# Patient Record
Sex: Male | Born: 1980 | Race: White | Hispanic: No | Marital: Single | Smoking: Never smoker
Health system: Southern US, Community
[De-identification: ages and names within clinical notes are randomized; demographics above are authoritative.]

## PROBLEM LIST (undated history)

## (undated) DIAGNOSIS — E785 Hyperlipidemia, unspecified: Secondary | ICD-10-CM

## (undated) DIAGNOSIS — R569 Unspecified convulsions: Secondary | ICD-10-CM

## (undated) DIAGNOSIS — F845 Asperger's syndrome: Secondary | ICD-10-CM

## (undated) DIAGNOSIS — F429 Obsessive-compulsive disorder, unspecified: Secondary | ICD-10-CM

## (undated) HISTORY — DX: Asperger's syndrome: F84.5

## (undated) HISTORY — DX: Obsessive-compulsive disorder, unspecified: F42.9

## (undated) HISTORY — DX: Hyperlipidemia, unspecified: E78.5

---
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
## 2002-05-24 ENCOUNTER — Emergency Department (HOSPITAL_COMMUNITY): Payer: Self-pay | Admitting: Emergency Medicine-WVUH

## 2005-04-04 ENCOUNTER — Ambulatory Visit (HOSPITAL_COMMUNITY): Admission: RE | Admit: 2005-04-04 | Discharge: 2005-04-04 | Payer: Self-pay | Admitting: Family Medicine

## 2005-12-27 ENCOUNTER — Emergency Department (HOSPITAL_COMMUNITY): Admission: EM | Admit: 2005-12-27 | Discharge: 2005-12-27 | Payer: Self-pay | Admitting: Emergency Medicine

## 2007-12-29 ENCOUNTER — Emergency Department (HOSPITAL_COMMUNITY): Admission: EM | Admit: 2007-12-29 | Discharge: 2007-12-29 | Payer: Self-pay | Admitting: Emergency Medicine

## 2008-08-25 ENCOUNTER — Ambulatory Visit (HOSPITAL_COMMUNITY): Admission: RE | Admit: 2008-08-25 | Discharge: 2008-08-25 | Payer: Self-pay | Admitting: Family Medicine

## 2010-01-10 ENCOUNTER — Ambulatory Visit (HOSPITAL_COMMUNITY)
Admission: RE | Admit: 2010-01-10 | Discharge: 2010-01-10 | Payer: Self-pay | Source: Home / Self Care | Attending: Family Medicine | Admitting: Family Medicine

## 2011-01-23 ENCOUNTER — Ambulatory Visit (INDEPENDENT_AMBULATORY_CARE_PROVIDER_SITE_OTHER): Payer: Medicaid Other | Admitting: Otolaryngology

## 2011-01-23 DIAGNOSIS — H6120 Impacted cerumen, unspecified ear: Secondary | ICD-10-CM

## 2011-05-22 ENCOUNTER — Ambulatory Visit (INDEPENDENT_AMBULATORY_CARE_PROVIDER_SITE_OTHER): Payer: Medicare Other | Admitting: Otolaryngology

## 2011-05-22 DIAGNOSIS — H6120 Impacted cerumen, unspecified ear: Secondary | ICD-10-CM

## 2011-10-09 ENCOUNTER — Ambulatory Visit (INDEPENDENT_AMBULATORY_CARE_PROVIDER_SITE_OTHER): Payer: Medicare Other | Admitting: Otolaryngology

## 2011-10-09 DIAGNOSIS — H6120 Impacted cerumen, unspecified ear: Secondary | ICD-10-CM

## 2011-12-15 DIAGNOSIS — L219 Seborrheic dermatitis, unspecified: Secondary | ICD-10-CM | POA: Insufficient documentation

## 2011-12-15 DIAGNOSIS — D229 Melanocytic nevi, unspecified: Secondary | ICD-10-CM | POA: Insufficient documentation

## 2012-04-06 ENCOUNTER — Other Ambulatory Visit: Payer: Self-pay | Admitting: Family Medicine

## 2012-04-06 LAB — HEPATIC FUNCTION PANEL
ALT: 38 U/L (ref 0–53)
AST: 21 U/L (ref 0–37)
Albumin: 4.5 g/dL (ref 3.5–5.2)
Alkaline Phosphatase: 53 U/L (ref 39–117)
Total Protein: 6.8 g/dL (ref 6.0–8.3)

## 2012-04-06 LAB — GLUCOSE, RANDOM: Glucose, Bld: 94 mg/dL (ref 70–99)

## 2012-04-06 LAB — LIPID PANEL
Cholesterol: 228 mg/dL — ABNORMAL HIGH (ref 0–200)
HDL: 28 mg/dL — ABNORMAL LOW (ref >39)
Total CHOL/HDL Ratio: 8.1 Ratio
Triglycerides: 367 mg/dL — ABNORMAL HIGH (ref <150)

## 2012-04-06 LAB — SEDIMENTATION RATE: Sed Rate: 1 mm/hr (ref 0–16)

## 2012-04-15 ENCOUNTER — Telehealth: Payer: Self-pay | Admitting: Family Medicine

## 2012-04-15 NOTE — Telephone Encounter (Signed)
Discussed lab results with patient and his mother. Mother stated that lab work for arthritis and inflammation was suppose to be done also because that is what patient was originally seen for. Please advise.

## 2012-04-15 NOTE — Telephone Encounter (Signed)
Patients mother is returning your call. Please call her back ASAP.   If you cannot reach her at her home number, you can reach her at her cell. [phone]

## 2012-04-19 ENCOUNTER — Telehealth: Payer: Self-pay | Admitting: Family Medicine

## 2012-04-19 NOTE — Telephone Encounter (Signed)
Pt would like copy of his BW mailed to his home address please

## 2012-04-19 NOTE — Telephone Encounter (Signed)
Results mailed to patient per patient request.

## 2012-11-02 ENCOUNTER — Ambulatory Visit (INDEPENDENT_AMBULATORY_CARE_PROVIDER_SITE_OTHER): Payer: Medicare Other | Admitting: Family Medicine

## 2012-11-02 ENCOUNTER — Encounter: Payer: Self-pay | Admitting: Family Medicine

## 2012-11-02 VITALS — BP 132/90 | Temp 98.5°F | Ht 69.0 in | Wt 228.8 lb

## 2012-11-02 DIAGNOSIS — J329 Chronic sinusitis, unspecified: Secondary | ICD-10-CM

## 2012-11-02 MED ORDER — AZITHROMYCIN 250 MG PO TABS: ORAL_TABLET | ORAL | Status: DC

## 2012-11-02 NOTE — Progress Notes (Signed)
  Subjective:    Patient ID: Jeffrey Compton, male    DOB: 1980/10/25, 32 y.o.   MRN: [id number]  Sinusitis This is a new problem. The current episode started in the past 7 days. Associated symptoms include chills, congestion, coughing, headaches, a hoarse voice and sinus pressure. Past treatments include acetaminophen. The treatment provided mild relief.   Started sat aft,  No known exposure  Started sore thraot, presssure in the sinus, positve green disch  No measureable fever   Review of Systems  Constitutional: Positive for chills.  HENT: Positive for congestion, hoarse voice and sinus pressure.   Respiratory: Positive for cough.   Neurological: Positive for headaches.       Objective:   Physical Exam Alert no acute distress. Lungs clear. Heart regular in rhythm. Frontal and maxillary tenderness evident. Nasal congestion present. Pharynx erythematous neck supple. Lungs clear heart regular in rhythm.  Blood pressure repeat 132/80     Assessment & Plan:  Impression acute rhinosinusitis. Plan Z-Pak. Patient states this has worked well in the past. Symptomatic care discussed. WSL

## 2013-01-19 ENCOUNTER — Telehealth: Payer: Self-pay | Admitting: Family Medicine

## 2013-01-19 ENCOUNTER — Ambulatory Visit: Payer: Medicare Other | Admitting: Family Medicine

## 2013-01-19 MED ORDER — PREDNISONE 20 MG PO TABS: ORAL_TABLET | ORAL | Status: DC

## 2013-01-19 NOTE — Telephone Encounter (Signed)
Pt would like to go ahead and get something called in for the poison oak sent to [facility], appt cancelled

## 2013-01-19 NOTE — Telephone Encounter (Signed)
Same dose as fa

## 2013-01-19 NOTE — Telephone Encounter (Signed)
Rx sent electronically to pharmacy. Patient notified. 

## 2013-01-19 NOTE — Telephone Encounter (Signed)
Requests prednisone taper 

## 2013-02-08 ENCOUNTER — Emergency Department (HOSPITAL_COMMUNITY)
Admission: EM | Admit: 2013-02-08 | Discharge: 2013-02-09 | Disposition: A | Discharge status: Home / Self Care | Payer: Medicare Other | Attending: Emergency Medicine | Admitting: Emergency Medicine

## 2013-02-08 ENCOUNTER — Encounter (HOSPITAL_COMMUNITY): Payer: Self-pay | Admitting: Emergency Medicine

## 2013-02-08 DIAGNOSIS — T5491XA Toxic effect of unspecified corrosive substance, accidental (unintentional), initial encounter: Secondary | ICD-10-CM | POA: Insufficient documentation

## 2013-02-08 DIAGNOSIS — Z792 Long term (current) use of antibiotics: Secondary | ICD-10-CM | POA: Insufficient documentation

## 2013-02-08 DIAGNOSIS — Y929 Unspecified place or not applicable: Secondary | ICD-10-CM | POA: Insufficient documentation

## 2013-02-08 DIAGNOSIS — IMO0002 Reserved for concepts with insufficient information to code with codable children: Secondary | ICD-10-CM | POA: Insufficient documentation

## 2013-02-08 DIAGNOSIS — Y93E9 Activity, other interior property and clothing maintenance: Secondary | ICD-10-CM | POA: Insufficient documentation

## 2013-02-08 DIAGNOSIS — J029 Acute pharyngitis, unspecified: Secondary | ICD-10-CM | POA: Insufficient documentation

## 2013-02-08 MED ORDER — FAMOTIDINE 20 MG PO TABS: 20.0000 mg | ORAL_TABLET | Freq: Two times a day (BID) | ORAL | Status: DC

## 2013-02-08 MED ORDER — ESOMEPRAZOLE MAGNESIUM 40 MG PO CPDR: 40.0000 mg | DELAYED_RELEASE_CAPSULE | Freq: Every day | ORAL | Status: DC

## 2013-02-08 MED ORDER — FAMOTIDINE 20 MG PO TABS
20.0000 mg | ORAL_TABLET | Freq: Once | ORAL | Status: AC
  Administered 2013-02-08: 20 mg via ORAL
  Filled 2013-02-08: qty 1

## 2013-02-08 MED ORDER — PANTOPRAZOLE SODIUM 40 MG PO TBEC
40.0000 mg | DELAYED_RELEASE_TABLET | Freq: Once | ORAL | Status: AC
  Administered 2013-02-08: 40 mg via ORAL
  Filled 2013-02-08: qty 1

## 2013-02-08 NOTE — ED Provider Notes (Signed)
CSN: [id number]     Arrival date & time 02/08/13  2234 History   First MD Initiated Contact with Patient 02/08/13 2328     Chief Complaint  Patient presents with  . Ingestion   (Consider location/radiation/quality/duration/timing/severity/associated sxs/prior Treatment) HPI Comments: Patient is a 33 year old male who presents to the emergency department after having ingested a small amount of Clorox solution. The patient states that he put a small about 1 ounce of Clorox in 8 ounces of water and placed in a thermos for cleaning. Approximately 2 hours before arrival to the emergency department he drank one swallow of this solution. The patient states his son was in his stomach he vomited. But his head complaining of sore throat since that time. He has not been coughing. He has not been coughing up any blood. He does not recall feeling as though he may have aspirated anything. He states he has been able to drink since this incident occurred. The patient has not taken any medication for the problem up to this point.  Patient is a 33 y.o. male presenting with Ingested Medication. The history is provided by the patient.  Ingestion This is a new problem. Pertinent negatives include no abdominal pain, arthralgias, chest pain, coughing or neck pain.    History reviewed. No pertinent past medical history. History reviewed. No pertinent past surgical history. No family history on file. History  Substance Use Topics  . Smoking status: Never Smoker   . Smokeless tobacco: Not on file  . Alcohol Use: No    Review of Systems  Constitutional: Negative for activity change.       All ROS Neg except as noted in HPI  HENT: Negative for nosebleeds.   Eyes: Negative for photophobia and discharge.  Respiratory: Negative for cough, shortness of breath and wheezing.   Cardiovascular: Negative for chest pain and palpitations.  Gastrointestinal: Negative for abdominal pain and blood in stool.  Genitourinary:  Negative for dysuria, frequency and hematuria.  Musculoskeletal: Negative for arthralgias, back pain and neck pain.  Skin: Negative.   Neurological: Negative for dizziness, seizures and speech difficulty.  Psychiatric/Behavioral: Negative for hallucinations and confusion.    Allergies  Keflex  Home Medications   Current Outpatient Rx  Name  Route  Sig  Dispense  Refill  . azithromycin (ZITHROMAX Z-PAK) 250 MG tablet      Take 2 tablets (500 mg) on  Day 1,  followed by 1 tablet (250 mg) once daily on Days 2 through 5.   6 each   0   . predniSONE (DELTASONE) 20 MG tablet      3 a day for 3 days, 2 a day for 3 days and 1 a day for 2 days.   17 tablet   0    BP 129/86  Pulse 104  Temp(Src) 98.1 F (36.7 C) (Oral)  Resp 16  Ht 5\' 9"  (1.753 m)  Wt 223 lb (101.152 kg)  BMI 32.92 kg/m2  SpO2 98% Physical Exam  Nursing note and vitals reviewed. Constitutional: He is oriented to person, place, and time. He appears well-developed and well-nourished.  Non-toxic appearance.  HENT:  Head: Normocephalic.  Right Ear: Tympanic membrane normal.  Left Ear: Tympanic membrane normal.  There no burns, redness, or areas of irritation of the outer lips, or the mucosa of the lips. There is no lesions or noted trauma or injury to the tongue. There no burns or redness or injury to the posterior pharynx. The uvula is in  the midline, and appears to be of normal size. The posterior pharynx is pain to any shows no burn or swelling. The patient can swallow without problem. The trachea is in the midline.  Eyes: EOM and lids are normal. Pupils are equal, round, and reactive to light.  Neck: Normal range of motion. Neck supple. Carotid bruit is not present. No tracheal deviation present.  Cardiovascular: Normal rate, regular rhythm, normal heart sounds, intact distal pulses and normal pulses.   Pulmonary/Chest: Breath sounds normal. No stridor. No respiratory distress. He has no wheezes. He has no rales.   Abdominal: Soft. Bowel sounds are normal. There is no tenderness. There is no guarding.  Musculoskeletal: Normal range of motion.  Lymphadenopathy:       Head (right side): No submandibular adenopathy present.       Head (left side): No submandibular adenopathy present.    He has no cervical adenopathy.  Neurological: He is alert and oriented to person, place, and time. He has normal strength. No cranial nerve deficit or sensory deficit.  Skin: Skin is warm and dry.  Psychiatric: He has a normal mood and affect. His speech is normal.    ED Course  Procedures (including critical care time) Labs Review Labs Reviewed - No data to display Imaging Review No results found.  EKG Interpretation   None       MDM  No diagnosis found. *I have reviewed nursing notes, vital signs, and all appropriate lab and imaging results for this patient.**  No evidence of burns, or trauma to the mucosa of the mouth, or the posterior pharynx. The patient will be treated with Protonix and Pepcid for possible reflux. The patient is advised to return to the emergency department if any changes, problems, or concerns.  Lenox Ahr, PA-C 02/08/13 2353

## 2013-02-08 NOTE — Discharge Instructions (Signed)
Please increase water intake. Please use Nexium daily, Pepcid 2 times daily. Please return to the emergency department if any difficulty swallowing or any other problems.

## 2013-02-08 NOTE — ED Notes (Signed)
Pt had put approx 8oz of water and one oz of clorox in a thermos for cleaning purposes this morning and then forgot the clorox was in there and drank one swallow. Vomited immediately after. Now c/o sore throat.

## 2013-02-09 NOTE — ED Provider Notes (Signed)
Medical screening examination/treatment/procedure(s) were performed by non-physician practitioner and as supervising physician I was immediately available for consultation/collaboration.  EKG Interpretation   None         Wynetta Fines, MD 02/09/13 [phone]

## 2013-03-26 ENCOUNTER — Encounter: Payer: Self-pay | Admitting: *Deleted

## 2013-04-01 ENCOUNTER — Encounter: Payer: Self-pay | Admitting: Family Medicine

## 2013-04-01 ENCOUNTER — Ambulatory Visit (INDEPENDENT_AMBULATORY_CARE_PROVIDER_SITE_OTHER): Payer: Medicare Other | Admitting: Family Medicine

## 2013-04-01 VITALS — BP 128/80 | Ht 69.0 in | Wt 237.0 lb

## 2013-04-01 DIAGNOSIS — F411 Generalized anxiety disorder: Secondary | ICD-10-CM

## 2013-04-01 MED ORDER — FLUOXETINE HCL 10 MG PO TABS: 10.0000 mg | ORAL_TABLET | Freq: Every day | ORAL | Status: DC

## 2013-04-01 NOTE — Progress Notes (Signed)
   Subjective:    Patient ID: Jeffrey Compton, male    DOB: March 30, 1980, 33 y.o.   MRN: [id number]  HPI Patient is here today to talk about getting on an anti-depressant medication.   No other concerns.  Patient states at times he feels stressed other times slightly fatigued he is trying eat healthy. He states he is trying to do a good job of exercising some but he states at times he does get nervous and anxious and tends to snack. He would like to try something to help him with his moods. He knows he needs to be better. He denies being suicidal. Does not want counseling at this point. PMH benign has had times before where we have tried serotonin reuptake inhibitors.  Review of Systems See above denies being suicidal denies fever chills chest pains    Objective:   Physical Exam  Lungs clear hearts regular pulse normal      Assessment & Plan:  Anxiety related issues-he does have some depression symptoms I believe it is reasonable to start Prozac. I would like to follow him back up in 3-4 weeks. If any problems with the medicine he is to let us know. If he becomes suicidal he is to let us know. Your suicidal immediately go to ER immediately followup

## 2013-04-26 ENCOUNTER — Telehealth: Payer: Self-pay | Admitting: Family Medicine

## 2013-04-26 NOTE — Telephone Encounter (Signed)
If he refuses counseling at this point in time he will need to followup with [place] within that 3 to four-week parameter thank you

## 2013-04-26 NOTE — Telephone Encounter (Signed)
Please clarify - referral in system for psychiatry but office note states to follow up here 3-4 weeks and that the patient declines counseling at this time

## 2013-05-03 NOTE — Telephone Encounter (Signed)
Referral faxed to [facility], they contact pt to set up appt

## 2013-08-25 ENCOUNTER — Ambulatory Visit (INDEPENDENT_AMBULATORY_CARE_PROVIDER_SITE_OTHER): Payer: Medicare Other | Admitting: Otolaryngology

## 2013-08-25 DIAGNOSIS — H6120 Impacted cerumen, unspecified ear: Secondary | ICD-10-CM

## 2014-05-29 DIAGNOSIS — H60331 Swimmer's ear, right ear: Secondary | ICD-10-CM | POA: Diagnosis not present

## 2014-05-29 DIAGNOSIS — H6123 Impacted cerumen, bilateral: Secondary | ICD-10-CM | POA: Diagnosis not present

## 2014-07-11 DIAGNOSIS — H6121 Impacted cerumen, right ear: Secondary | ICD-10-CM | POA: Diagnosis not present

## 2014-07-26 DIAGNOSIS — H60331 Swimmer's ear, right ear: Secondary | ICD-10-CM | POA: Diagnosis not present

## 2014-09-15 DIAGNOSIS — H6121 Impacted cerumen, right ear: Secondary | ICD-10-CM | POA: Diagnosis not present

## 2014-10-31 DIAGNOSIS — H6123 Impacted cerumen, bilateral: Secondary | ICD-10-CM | POA: Diagnosis not present

## 2014-11-05 ENCOUNTER — Encounter (HOSPITAL_COMMUNITY): Payer: Self-pay | Admitting: *Deleted

## 2014-11-05 ENCOUNTER — Emergency Department (INDEPENDENT_AMBULATORY_CARE_PROVIDER_SITE_OTHER)
Admission: EM | Admit: 2014-11-05 | Discharge: 2014-11-05 | Disposition: A | Discharge status: Home / Self Care | Payer: Medicare Other | Source: Home / Self Care | Attending: Family Medicine | Admitting: Family Medicine

## 2014-11-05 DIAGNOSIS — H6091 Unspecified otitis externa, right ear: Secondary | ICD-10-CM

## 2014-11-05 MED ORDER — HYDROCODONE-ACETAMINOPHEN 5-325 MG PO TABS: 1.0000 | ORAL_TABLET | Freq: Four times a day (QID) | ORAL | Status: DC | PRN

## 2014-11-05 MED ORDER — LEVOFLOXACIN 500 MG PO TABS: 500.0000 mg | ORAL_TABLET | Freq: Every day | ORAL | Status: DC

## 2014-11-05 NOTE — ED Notes (Signed)
Pt has had recurrent right ear infections; saw Dr. Benjamine Mola 3 days ago - was started on Ciprodex ear drops.  States right ear pain is getting worse.  Also c/o significant right frontal and maxillary sinus pressure.  Denies fevers.

## 2014-11-05 NOTE — ED Provider Notes (Signed)
CSN: [id number]     Arrival date & time 11/05/14  1913 History   First MD Initiated Contact with Patient 11/05/14 1934     Chief Complaint  Patient presents with  . Otalgia  . Facial Pain   (Consider location/radiation/quality/duration/timing/severity/associated sxs/prior Treatment) Patient is a 34 y.o. male presenting with ear pain. The history is provided by the patient.  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 He comes in with a friend tonight because of progressive right ear pain. He was seen by his ENT doctor for 5 days ago and the ear canal was lavaged because he has renarrowing ear canals. He was given Cipro otic drops.  Since that time his ear is getting progressively more painful. The canal feels blocked but patient has retained his balance.  Past Medical History  Diagnosis Date  . Asperger syndrome   . OCD (obsessive compulsive disorder)   . Hyperlipidemia    No past surgical history on file. Family History  Problem Relation Age of Onset  . Heart disease Other    Social History  Substance Use Topics  . Smoking status: Never Smoker   . Smokeless tobacco: None  . Alcohol Use: No    Review of Systems  Constitutional: Negative.   HENT: Positive for congestion and ear pain. Negative for drooling, ear discharge, hearing loss, mouth sores, nosebleeds, postnasal drip, rhinorrhea, sore throat and tinnitus.   Eyes: Negative.   Respiratory: Negative.  Negative for cough.   Cardiovascular: Negative.    Gastrointestinal: Negative.  Negative for vomiting.  Genitourinary: Negative.   Musculoskeletal: Negative for neck pain.  Neurological: Positive for headaches.    Allergies  Celexa and Keflex  Home Medications   Prior to Admission medications   Medication Sig Start Date End Date Taking? Authorizing Provider  esomeprazole (NEXIUM) 40 MG capsule Take 1 capsule (40 mg total) by mouth daily. 02/08/13   Lily Kocher, PA-C  famotidine (PEPCID) 20 MG tablet Take 1 tablet (20 mg total) by mouth 2 (two) times daily. 02/08/13   Lily Kocher, PA-C  FLUoxetine (PROZAC) 10 MG tablet Take 1 tablet (10 mg total) by mouth daily. 04/01/13   Kathyrn Drown, MD   Meds Ordered and Administered this Visit  Medications - No data to display  BP 134/83 mmHg  Pulse 95  Temp(Src) 97.9 F (36.6 C) (Oral)  Resp 18  SpO2 100% No data found.   Physical Exam  Constitutional: He appears well-developed and well-nourished.  HENT:  Head: Normocephalic and atraumatic.  Left Ear: External ear normal.  Patient has pain with manipulation of his right auricle Nose is mildly swollen on the right There is no swelling of the right cheek Patient has a positive pretragal node on the right The ear canal on the right is completely obstructed with swelling and there is mild redness at the os of the canal  Eyes: Conjunctivae and EOM are normal. Pupils are equal, round, and reactive to light.  Neck: Normal range of motion. Neck supple.  Cardiovascular: Normal rate.  Pulmonary/Chest: Effort normal.  Musculoskeletal: Normal range of motion.  Lymphadenopathy:    He has no cervical adenopathy.  Nursing note and vitals reviewed.   ED Course  Procedures (including critical care time)     MDM      ICD-9-CM ICD-10-CM   1. Otitis externa, right 380.10 H60.91 levofloxacin (LEVAQUIN) 500 MG tablet     HYDROcodone-acetaminophen (NORCO) 5-325 MG tablet     Signed, Robyn Haber, MD     Robyn Haber,  MD 11/05/14 1946

## 2014-11-05 NOTE — Discharge Instructions (Signed)
Otitis Externa Otitis externa is a germ infection in the outer ear. The outer ear is the area from the eardrum to the outside of the ear. Otitis externa is sometimes called "swimmer's ear." HOME CARE  Put drops in the ear as told by your doctor.  Only take medicine as told by your doctor.  If you have diabetes, your doctor may give you more directions. Follow your doctor's directions.  Keep all doctor visits as told. To avoid another infection:  Keep your ear dry. Use the corner of a towel to dry your ear after swimming or bathing.  Avoid scratching or putting things inside your ear.  Avoid swimming in lakes, dirty water, or pools that use a chemical called chlorine poorly.  You may use ear drops after swimming. Combine equal amounts of white vinegar and alcohol in a bottle. Put 3 or 4 drops in each ear. GET HELP IF:   You have a fever.  Your ear is still red, puffy (swollen), or painful after 3 days.  You still have yellowish-white fluid (pus) coming from the ear after 3 days.  Your redness, puffiness, or pain gets worse.  You have a really bad headache.  You have redness, puffiness, pain, or tenderness behind your ear. MAKE SURE YOU:   Understand these instructions.  Will watch your condition.  Will get help right away if you are not doing well or get worse.   This information is not intended to replace advice given to you by your health care provider. Make sure you discuss any questions you have with your health care provider.   Document Released: 06/25/2007 Document Revised: 01/27/2014 Document Reviewed: 01/23/2011 Elsevier Interactive Patient Education 2016 [employer]  

## 2014-11-08 DIAGNOSIS — H60331 Swimmer's ear, right ear: Secondary | ICD-10-CM | POA: Diagnosis not present

## 2014-11-15 DIAGNOSIS — H60331 Swimmer's ear, right ear: Secondary | ICD-10-CM | POA: Diagnosis not present

## 2014-12-08 DIAGNOSIS — Z6832 Body mass index (BMI) 32.0-32.9, adult: Secondary | ICD-10-CM | POA: Diagnosis not present

## 2014-12-08 DIAGNOSIS — E669 Obesity, unspecified: Secondary | ICD-10-CM | POA: Diagnosis not present

## 2014-12-08 DIAGNOSIS — Z1389 Encounter for screening for other disorder: Secondary | ICD-10-CM | POA: Diagnosis not present

## 2014-12-08 DIAGNOSIS — F419 Anxiety disorder, unspecified: Secondary | ICD-10-CM | POA: Diagnosis not present

## 2014-12-29 ENCOUNTER — Emergency Department (INDEPENDENT_AMBULATORY_CARE_PROVIDER_SITE_OTHER)
Admission: EM | Admit: 2014-12-29 | Discharge: 2014-12-29 | Disposition: A | Discharge status: Home / Self Care | Payer: Medicare Other | Source: Home / Self Care | Attending: Family Medicine | Admitting: Family Medicine

## 2014-12-29 ENCOUNTER — Encounter (HOSPITAL_COMMUNITY): Payer: Self-pay | Admitting: Emergency Medicine

## 2014-12-29 DIAGNOSIS — H6121 Impacted cerumen, right ear: Secondary | ICD-10-CM | POA: Diagnosis not present

## 2014-12-29 DIAGNOSIS — J069 Acute upper respiratory infection, unspecified: Secondary | ICD-10-CM | POA: Diagnosis not present

## 2014-12-29 MED ORDER — AZITHROMYCIN 250 MG PO TABS: ORAL_TABLET | ORAL | Status: DC

## 2014-12-29 MED ORDER — IPRATROPIUM BROMIDE 0.06 % NA SOLN: 2.0000 | Freq: Four times a day (QID) | NASAL | Status: DC

## 2014-12-29 NOTE — ED Notes (Signed)
Jeffrey Compton and fever since tuesday

## 2014-12-29 NOTE — Discharge Instructions (Signed)
Drink plenty of fluids as discussed, use medicine as prescribed, and mucinex or delsym for cough. Return or see your doctor if further problems °

## 2014-12-29 NOTE — ED Provider Notes (Signed)
CSN: [id number]     Arrival date & time 12/29/14  1435 History   First MD Initiated Contact with Patient 12/29/14 1556     Chief Complaint  Patient presents with  . URI   (Consider location/radiation/quality/duration/timing/severity/associated sxs/prior Treatment) Patient is a 34 y.o. male presenting with URI. The history is provided by the patient.  URI Presenting symptoms: congestion, cough and rhinorrhea   Presenting symptoms: no fever   Severity:  Moderate Onset quality:  Gradual Duration:  3 days Progression:  Unchanged Chronicity:  New Relieved by:  None tried Worsened by:  Nothing tried Ineffective treatments:  None tried Associated symptoms: sinus pain     Past Medical History  Diagnosis Date  . Asperger syndrome   . OCD (obsessive compulsive disorder)   . Hyperlipidemia    History reviewed. No pertinent past surgical history. Family History  Problem Relation Age of Onset  . Heart disease Other    Social History  Substance Use Topics  . Smoking status: Never Smoker   . Smokeless tobacco: None  . Alcohol Use: Yes     Comment: occasional    Review of Systems  Constitutional: Negative.  Negative for fever.  HENT: Positive for congestion, postnasal drip, rhinorrhea and sinus pressure. Negative for facial swelling.   Respiratory: Positive for cough.   Cardiovascular: Negative.   All other systems reviewed and are negative.   Allergies  Celexa and Keflex  Home Medications   Prior to Admission medications   Medication Sig Start Date End Date Taking? Authorizing Provider  azithromycin (ZITHROMAX Z-PAK) 250 MG tablet Take as directed on pack 12/29/14   Billy Fischer, MD  esomeprazole (NEXIUM) 40 MG capsule Take 1 capsule (40 mg total) by mouth daily. 02/08/13   Lily Kocher, PA-C  famotidine (PEPCID) 20 MG tablet Take 1 tablet (20 mg total) by mouth 2 (two) times daily. 02/08/13   Lily Kocher, PA-C  FLUoxetine (PROZAC) 10 MG tablet Take 1 tablet (10 mg  total) by mouth daily. 04/01/13   Kathyrn Drown, MD  HYDROcodone-acetaminophen (NORCO) 5-325 MG tablet Take 1 tablet by mouth every 6 (six) hours as needed. 11/05/14   Robyn Haber, MD  ipratropium (ATROVENT) 0.06 % nasal spray Place 2 sprays into both nostrils 4 (four) times daily. 12/29/14   Billy Fischer, MD  levofloxacin (LEVAQUIN) 500 MG tablet Take 1 tablet (500 mg total) by mouth daily. 11/05/14   Robyn Haber, MD   Meds Ordered and Administered this Visit  Medications - No data to display  BP 114/79 mmHg  Pulse 92  Temp(Src) 98.8 F (37.1 C) (Oral)  Resp 16  SpO2 98% No data found.   Physical Exam  Constitutional: He is oriented to person, place, and time. He appears well-developed and well-nourished. No distress.  HENT:  Head: Normocephalic.  Right Ear: External ear normal.  Left Ear: External ear normal.  Nose: Mucosal edema and rhinorrhea present.  Mouth/Throat: Oropharynx is clear and moist.  Neck: Normal range of motion. Neck supple.  Cardiovascular: Normal heart sounds.   Pulmonary/Chest: Effort normal and breath sounds normal.  Lymphadenopathy:    He has no cervical adenopathy.  Neurological: He is alert and oriented to person, place, and time.  Skin: Skin is warm and dry.  Nursing note and vitals reviewed.   ED Course  Procedures (including critical care time)  Labs Review Labs Reviewed - No data to display  Imaging Review No results found.   Visual Acuity Review  Right  Eye Distance:   Left Eye Distance:   Bilateral Distance:    Right Eye Near:   Left Eye Near:    Bilateral Near:         MDM   1. URI (upper respiratory infection)        Billy Fischer, MD 12/29/14 [phone]

## 2015-02-12 DIAGNOSIS — H6123 Impacted cerumen, bilateral: Secondary | ICD-10-CM | POA: Diagnosis not present

## 2015-02-19 ENCOUNTER — Encounter: Payer: Self-pay | Admitting: Family Medicine

## 2015-02-19 ENCOUNTER — Ambulatory Visit (INDEPENDENT_AMBULATORY_CARE_PROVIDER_SITE_OTHER): Payer: Medicare Other | Admitting: Family Medicine

## 2015-02-19 VITALS — BP 130/78 | Temp 98.0°F | Ht 69.0 in | Wt 220.1 lb

## 2015-02-19 DIAGNOSIS — R21 Rash and other nonspecific skin eruption: Secondary | ICD-10-CM

## 2015-02-19 MED ORDER — PREDNISONE 20 MG PO TABS: ORAL_TABLET | ORAL | Status: DC

## 2015-02-19 NOTE — Progress Notes (Signed)
   Subjective:    Patient ID: Jeffrey Compton, male    DOB: [birth year], 35 y.o.   MRN: [id number]  HPI Patient in today for poison oak to lip, and right eye. Onset 3 days ago. Has not tried any treatment.  Itches and burns  Positive history of poison oak and poison ivy  States no other concerns this visit.   Review of Systems No headache no sore throat cough ROS otherwise negative    Objective:   Physical Exam  Alert no apparent distress vital stable HEENT face multiple patches erythema linear shape some swelling right eyelid ocular exam within normal limits      Assessment & Plan:  Impression poison oak allergic reaction plan prednisone taper local measures discussed WSL

## 2015-03-01 ENCOUNTER — Ambulatory Visit (INDEPENDENT_AMBULATORY_CARE_PROVIDER_SITE_OTHER): Payer: Medicare Other | Admitting: Family Medicine

## 2015-03-01 ENCOUNTER — Encounter: Payer: Self-pay | Admitting: Family Medicine

## 2015-03-01 VITALS — BP 122/80 | Temp 98.3°F | Ht 69.0 in | Wt 220.2 lb

## 2015-03-01 DIAGNOSIS — R21 Rash and other nonspecific skin eruption: Secondary | ICD-10-CM

## 2015-03-01 DIAGNOSIS — H109 Unspecified conjunctivitis: Secondary | ICD-10-CM | POA: Diagnosis not present

## 2015-03-01 MED ORDER — PREDNISONE 20 MG PO TABS: ORAL_TABLET | ORAL | Status: DC

## 2015-03-01 MED ORDER — SULFACETAMIDE SODIUM 10 % OP SOLN: 2.0000 [drp] | Freq: Four times a day (QID) | OPHTHALMIC | Status: DC

## 2015-03-01 NOTE — Progress Notes (Signed)
   Subjective:    Patient ID: Jeffrey Compton, male    DOB: 11/10/1980, 35 y.o.   MRN: [id number]  Rash This is a new problem. The current episode started 1 to 4 weeks ago. The problem is unchanged. The affected locations include the face. The rash is characterized by redness. He was exposed to plant contact. Past treatments include oral steroids. The treatment provided mild relief.      Review of Systems  Skin: Positive for rash.   Describes puffiness around the eye describes some itching and burning also describes some drainage as well mucoid according to patient    Objective:   Physical Exam Erythematous area around the right eye with some swelling no obvious conjunctivitis although some redness of the inner conjunctiva throat is normal neck no masses       Assessment & Plan:  Allergic reaction with possible conjunctivitis antibiotic drops for the next 3-4 days prednisone taper follow-up if problems

## 2015-04-04 DIAGNOSIS — H6121 Impacted cerumen, right ear: Secondary | ICD-10-CM | POA: Diagnosis not present

## 2015-04-04 DIAGNOSIS — H60331 Swimmer's ear, right ear: Secondary | ICD-10-CM | POA: Diagnosis not present

## 2015-04-18 DIAGNOSIS — H6121 Impacted cerumen, right ear: Secondary | ICD-10-CM | POA: Diagnosis not present

## 2015-04-18 DIAGNOSIS — H60399 Other infective otitis externa, unspecified ear: Secondary | ICD-10-CM | POA: Diagnosis not present

## 2015-04-18 DIAGNOSIS — H9011 Conductive hearing loss, unilateral, right ear, with unrestricted hearing on the contralateral side: Secondary | ICD-10-CM | POA: Diagnosis not present

## 2015-04-25 ENCOUNTER — Emergency Department (HOSPITAL_COMMUNITY): Payer: Medicare Other

## 2015-04-25 ENCOUNTER — Encounter (HOSPITAL_COMMUNITY): Payer: Self-pay | Admitting: Emergency Medicine

## 2015-04-25 ENCOUNTER — Emergency Department (HOSPITAL_COMMUNITY)
Admission: EM | Admit: 2015-04-25 | Discharge: 2015-04-25 | Disposition: A | Discharge status: Home / Self Care | Payer: Medicare Other | Attending: Emergency Medicine | Admitting: Emergency Medicine

## 2015-04-25 DIAGNOSIS — E785 Hyperlipidemia, unspecified: Secondary | ICD-10-CM | POA: Insufficient documentation

## 2015-04-25 DIAGNOSIS — S81811A Laceration without foreign body, right lower leg, initial encounter: Secondary | ICD-10-CM

## 2015-04-25 DIAGNOSIS — W293XXA Contact with powered garden and outdoor hand tools and machinery, initial encounter: Secondary | ICD-10-CM | POA: Insufficient documentation

## 2015-04-25 DIAGNOSIS — Z23 Encounter for immunization: Secondary | ICD-10-CM | POA: Diagnosis not present

## 2015-04-25 DIAGNOSIS — Y999 Unspecified external cause status: Secondary | ICD-10-CM | POA: Insufficient documentation

## 2015-04-25 DIAGNOSIS — Y929 Unspecified place or not applicable: Secondary | ICD-10-CM | POA: Insufficient documentation

## 2015-04-25 DIAGNOSIS — S82201A Unspecified fracture of shaft of right tibia, initial encounter for closed fracture: Secondary | ICD-10-CM | POA: Insufficient documentation

## 2015-04-25 DIAGNOSIS — Y9389 Activity, other specified: Secondary | ICD-10-CM | POA: Insufficient documentation

## 2015-04-25 DIAGNOSIS — T148XXA Other injury of unspecified body region, initial encounter: Secondary | ICD-10-CM

## 2015-04-25 MED ORDER — HYDROCODONE-ACETAMINOPHEN 5-325 MG PO TABS: 1.0000 | ORAL_TABLET | Freq: Four times a day (QID) | ORAL | Status: DC | PRN

## 2015-04-25 MED ORDER — MORPHINE SULFATE (PF) 2 MG/ML IV SOLN
4.0000 mg | Freq: Once | INTRAVENOUS | Status: AC
  Administered 2015-04-25: 4 mg via INTRAVENOUS
  Filled 2015-04-25: qty 2

## 2015-04-25 MED ORDER — SODIUM CHLORIDE 0.9 % IR SOLN
2000.0000 mL | Freq: Once | Status: DC
Start: 2015-04-25 — End: 2015-04-25

## 2015-04-25 MED ORDER — SODIUM CHLORIDE 0.9 % IR SOLN
1000.0000 mL | Freq: Once | Status: DC
Start: 2015-04-25 — End: 2015-04-25

## 2015-04-25 MED ORDER — TETANUS-DIPHTH-ACELL PERTUSSIS 5-2.5-18.5 LF-MCG/0.5 IM SUSP
0.5000 mL | Freq: Once | INTRAMUSCULAR | Status: AC
  Administered 2015-04-25: 0.5 mL via INTRAMUSCULAR
  Filled 2015-04-25: qty 0.5

## 2015-04-25 MED ORDER — LIDOCAINE-EPINEPHRINE (PF) 2 %-1:200000 IJ SOLN
20.0000 mL | Freq: Once | INTRAMUSCULAR | Status: AC
  Administered 2015-04-25: 20 mL
  Filled 2015-04-25: qty 20

## 2015-04-25 MED ORDER — CLINDAMYCIN HCL 150 MG PO CAPS: 450.0000 mg | ORAL_CAPSULE | Freq: Four times a day (QID) | ORAL | Status: DC

## 2015-04-25 MED ORDER — FENTANYL CITRATE (PF) 100 MCG/2ML IJ SOLN
50.0000 ug | INTRAMUSCULAR | Status: DC | PRN
  Administered 2015-04-25: 50 ug via INTRAVENOUS
  Filled 2015-04-25: qty 2

## 2015-04-25 NOTE — Discharge Instructions (Signed)
Please call Dr. Aline Brochure tomorrow for same day follow-up regarding further management of your wound. Do not bear weight on your leg, and use crutches for ambulation. Return for worsening symptoms including fevers, worsening pain, numbness or weakness, or any other symptoms concerning to you.  Nonsutured Laceration Care A laceration is a cut that goes through all layers of the skin and extends into the tissue that is right under the skin. This type of cut is usually stitched up (sutured) or closed with tape (adhesive strips) or skin glue shortly after the injury happens. However, if the wound is dirty or if several hours pass before medical treatment is provided, it is likely that germs (bacteria) will enter the wound. Closing a laceration after bacteria have entered it increases the risk of infection. In these cases, your health care provider may leave the laceration open (nonsutured) and cover it with a bandage. This type of treatment helps prevent infection and allows the wound to heal from the deepest layer of tissue damage up to the surface. An open fracture is a type of injury that may involve nonsutured lacerations. An open fracture is a break in a bone that happens along with one or more lacerations through the skin that is near the fracture site. HOW TO CARE FOR YOUR NONSUTURED LACERATION  Take or apply over-the-counter and prescription medicines only as told by your health care provider.  If you were prescribed an antibiotic medicine, take or apply it as told by your health care provider. Do not stop using the antibiotic even if your condition improves.  Clean the wound one time each day or as told by your health care provider.  Wash the wound with mild soap and water.  Rinse the wound with water to remove all soap.  Pat your wound dry with a clean towel. Do not rub the wound.  Do not inject anything into the wound unless your health care provider told you to.  Change any bandages  (dressings) as told by your health care provider. This includes changing the dressing if it gets wet, dirty, or starts to smell bad.  Keep the dressing dry until your health care provider says it can be removed. Do not take baths, swim, or do anything that puts your wound underwater until your health care provider approves.  Raise (elevate) the injured area above the level of your heart while you are sitting or lying down, if possible.  Do not scratch or pick at the wound.  Check your wound every day for signs of infection. Watch for:  Redness, swelling, or pain.  Fluid, blood, or pus.  Keep all follow-up visits as told by your health care provider. This is important. SEEK MEDICAL CARE IF:  You received a tetanus and shot and you have swelling, severe pain, redness, or bleeding at the injection site.   You have a fever.  Your pain is not controlled with medicine.  You have increased redness, swelling, or pain at the site of your wound.  You have fluid, blood, or pus coming from your wound.  You notice a bad smell coming from your wound or your dressing.  You notice something coming out of the wound, such as wood or glass.  You notice a change in the color of your skin near your wound.  You develop a new rash.  You need to change the dressing frequently due to fluid, blood, or pus draining from the wound.  You develop numbness around your wound. SEEK  IMMEDIATE MEDICAL CARE IF:  Your pain suddenly increases and is severe.  You develop severe swelling around the wound.  The wound is on your hand or foot and you cannot properly move a finger or toe.  The wound is on your hand or foot and you notice that your fingers or toes look pale or bluish.  You have a red streak going away from your wound.   This information is not intended to replace advice given to you by your health care provider. Make sure you discuss any questions you have with your health care provider.     Document Released: 12/04/2005 Document Revised: 05/23/2014 Document Reviewed: 01/02/2014 Elsevier Interactive Patient Education [employer].

## 2015-04-25 NOTE — ED Provider Notes (Addendum)
CSN: [id number]     Arrival date & time 04/25/15  1337 History   First MD Initiated Contact with Patient 04/25/15 1458     Chief Complaint  Patient presents with  . Extremity Laceration     (Consider location/radiation/quality/duration/timing/severity/associated sxs/prior Treatment) HPI 35 year old male with history of aspirin or syndrome who presents with right leg injury. Not anticoagulated. States that was cutting tree branches with a chainsaw when the chainsaw kicked back and got caught on his pants. Unable to detail all his pants from the chainsaw and saw subsequently lacerated into his right lower leg. No fall or additional injuries. Last tetanus was 7-8 years ago. No numbness or weakness. No fevers or chills. Past Medical History  Diagnosis Date  . Asperger syndrome   . OCD (obsessive compulsive disorder)   . Hyperlipidemia    History reviewed. No pertinent past surgical history. Family History  Problem Relation Age of Onset  . Heart disease Other    Social History  Substance Use Topics  . Smoking status: Never Smoker   . Smokeless tobacco: None  . Alcohol Use: Yes     Comment: occasional    Review of Systems 10/14 systems reviewed and are negative other than those stated in the HPI    Allergies  Celexa and Keflex  Home Medications   Prior to Admission medications   Medication Sig Start Date End Date Taking? Authorizing Provider  clindamycin (CLEOCIN) 150 MG capsule Take 3 capsules (450 mg total) by mouth every 6 (six) hours. 04/25/15   Forde Dandy, MD  esomeprazole (NEXIUM) 40 MG capsule Take 1 capsule (40 mg total) by mouth daily. Patient not taking: Reported on 02/19/2015 02/08/13   Lily Kocher, PA-C  famotidine (PEPCID) 20 MG tablet Take 1 tablet (20 mg total) by mouth 2 (two) times daily. Patient not taking: Reported on 02/19/2015 02/08/13   Lily Kocher, PA-C  FLUoxetine (PROZAC) 10 MG tablet Take 1 tablet (10 mg total) by mouth daily. Patient not taking:  Reported on 02/19/2015 04/01/13   Kathyrn Drown, MD  HYDROcodone-acetaminophen (NORCO) 5-325 MG tablet Take 1 tablet by mouth every 6 (six) hours as needed. Patient not taking: Reported on 02/19/2015 11/05/14   Robyn Haber, MD  HYDROcodone-acetaminophen (NORCO/VICODIN) 5-325 MG tablet Take 1 tablet by mouth every 6 (six) hours as needed for moderate pain or severe pain. 04/25/15   Forde Dandy, MD  ipratropium (ATROVENT) 0.06 % nasal spray Place 2 sprays into both nostrils 4 (four) times daily. Patient not taking: Reported on 02/19/2015 12/29/14   Billy Fischer, MD  predniSONE (DELTASONE) 20 MG tablet Three qd for three, two qd for threed, one qd for two Patient not taking: Reported on 04/25/2015 03/01/15   Kathyrn Drown, MD  sulfacetamide (BLEPH-10) 10 % ophthalmic solution Place 2 drops into the right eye 4 (four) times daily. Patient not taking: Reported on 04/25/2015 03/01/15   Kathyrn Drown, MD   BP 132/67 mmHg  Pulse 79  Temp(Src) 98.3 F (36.8 C) (Oral)  Resp 15  Ht 5\' 10"  (1.778 m)  Wt 215 lb (97.523 kg)  BMI 30.85 kg/m2  SpO2 99% Physical Exam Physical Exam  Nursing note and vitals reviewed. Constitutional: Well developed, well nourished, non-toxic, and in no acute distress Head: Normocephalic and atraumatic.  Mouth/Throat: Oropharynx is clear and moist.  Neck: Normal range of motion. Neck supple.  Cardiovascular: Normal rate and regular rhythm.  +2 DP pulses bilaterally Pulmonary/Chest: Effort normal. Abdominal: Soft.  Musculoskeletal: Normal range of motion of knee and ankle of RLE. There is pain with dorsiflexion of the right ankle. Intact sensation involving sural, common deep and superficial peroneal, saphenous nerves Neurological: Alert, no facial droop, fluent speech, moves all extremities symmetrically Skin: Skin is warm and dry. 6 cm jagged laceration involving the anterior shin of the right leg, exposed muscle  Psychiatric: Cooperative  ED Course  Procedures (including  critical care time) Labs Review Labs Reviewed - No data to display  Imaging Review Dg Tibia/fibula Right  04/25/2015  CLINICAL DATA:  Chain saw laceration to the anterior aspect of the right lower leg today. EXAM: RIGHT TIBIA AND FIBULA - 2 VIEW COMPARISON:  None. FINDINGS: There is an irregular laceration of the anterior aspect of the right lower leg with multiple small bone fragments avulsed from the anterior cortex of the tibia. There is no fracture through the cortex. Fibula is normal. IMPRESSION: Multiple small avulsions of bone from the anterior cortex of the mid tibia. Electronically Signed   By: Lorriane Shire M.D.   On: 04/25/2015 15:09   I have personally reviewed and evaluated these images and lab results as part of my medical decision-making.   EKG Interpretation None      MDM   Final diagnoses:  Leg laceration, right, initial encounter  Avulsion fracture of bone   35 year old male who presents with a laceration from kicked back of a chainsaw to the right lower extremity. Extremity is neurovascularly intact, with intact range of motion of the ankle and toes distally. Bleeding controlled. X-ray shows avulsions of the anterior tibia. The wound is irrigated. Discussed with Dr. Aline Brochure from orthopedic surgery. Recommended keeping wound open. We will dress with wet to dry dressing, and splint in posterior short leg splint. We will discharge with crutches and he will have one day follow-up with Dr. Aline Brochure for wound reevaluation and ongoing management. We'll discharge with course of antibiotics. Strict return and follow-up instructions are reviewed with this patient. He expressed understanding of all discharge instructions and felt comfortable to plan of care.    Forde Dandy, MD 04/25/15 1832  Forde Dandy, MD 05/18/15 [phone]

## 2015-04-25 NOTE — ED Notes (Signed)
Patient states he was using chainsaw at home and it kicked back and hit his right leg. Bleeding under control fragments of dirt in leg wound.

## 2015-04-26 DIAGNOSIS — S81811A Laceration without foreign body, right lower leg, initial encounter: Secondary | ICD-10-CM | POA: Diagnosis not present

## 2015-04-27 DIAGNOSIS — S81811D Laceration without foreign body, right lower leg, subsequent encounter: Secondary | ICD-10-CM | POA: Diagnosis not present

## 2015-04-30 DIAGNOSIS — S81811D Laceration without foreign body, right lower leg, subsequent encounter: Secondary | ICD-10-CM | POA: Diagnosis not present

## 2015-05-03 DIAGNOSIS — S81811D Laceration without foreign body, right lower leg, subsequent encounter: Secondary | ICD-10-CM | POA: Diagnosis not present

## 2015-05-07 DIAGNOSIS — S81811D Laceration without foreign body, right lower leg, subsequent encounter: Secondary | ICD-10-CM | POA: Diagnosis not present

## 2015-05-09 DIAGNOSIS — H60333 Swimmer's ear, bilateral: Secondary | ICD-10-CM | POA: Diagnosis not present

## 2015-05-15 DIAGNOSIS — S81811D Laceration without foreign body, right lower leg, subsequent encounter: Secondary | ICD-10-CM | POA: Diagnosis not present

## 2015-05-16 DIAGNOSIS — H60331 Swimmer's ear, right ear: Secondary | ICD-10-CM | POA: Diagnosis not present

## 2015-05-21 DIAGNOSIS — S81811D Laceration without foreign body, right lower leg, subsequent encounter: Secondary | ICD-10-CM | POA: Diagnosis not present

## 2015-05-25 DIAGNOSIS — S81811D Laceration without foreign body, right lower leg, subsequent encounter: Secondary | ICD-10-CM | POA: Diagnosis not present

## 2015-06-01 DIAGNOSIS — S81811D Laceration without foreign body, right lower leg, subsequent encounter: Secondary | ICD-10-CM | POA: Diagnosis not present

## 2015-06-28 ENCOUNTER — Encounter: Payer: Self-pay | Admitting: Family Medicine

## 2015-06-28 ENCOUNTER — Ambulatory Visit (INDEPENDENT_AMBULATORY_CARE_PROVIDER_SITE_OTHER): Payer: PPO | Admitting: Family Medicine

## 2015-06-28 VITALS — BP 112/80 | Temp 98.3°F | Ht 69.0 in | Wt 218.0 lb

## 2015-06-28 DIAGNOSIS — L259 Unspecified contact dermatitis, unspecified cause: Secondary | ICD-10-CM

## 2015-06-28 MED ORDER — PREDNISONE 20 MG PO TABS: ORAL_TABLET | ORAL | Status: DC

## 2015-06-28 MED ORDER — METHYLPREDNISOLONE ACETATE 40 MG/ML IJ SUSP
40.0000 mg | Freq: Once | INTRAMUSCULAR | Status: AC
  Administered 2015-06-28: 40 mg via INTRAMUSCULAR

## 2015-06-28 NOTE — Progress Notes (Signed)
   Subjective:    Patient ID: Jeffrey Compton, male    DOB: 04/15/1980, 35 y.o.   MRN: [id number]  Rash This is a new problem. The current episode started in the past 7 days. The problem is unchanged. The affected locations include the left arm and right arm. The rash is characterized by redness and itchiness. He was exposed to plant contact. Past treatments include nothing. The treatment provided no relief.   Patient has no other concerns at this time.  Doing a lot of wood cutting recently  Review of Systems  Skin: Positive for rash.   Denies fever chills sweats    Objective:   Physical Exam  Significant contact dermatitis on the arms consistent with poison ivy lungs clear heart regular      Assessment & Plan:  Contact dermatitis prednisone taper steroid shot warning signs discussed follow-up from

## 2015-07-25 ENCOUNTER — Encounter: Payer: Self-pay | Admitting: Family Medicine

## 2015-07-25 ENCOUNTER — Ambulatory Visit (INDEPENDENT_AMBULATORY_CARE_PROVIDER_SITE_OTHER): Payer: PPO | Admitting: Family Medicine

## 2015-07-25 VITALS — BP 122/70 | Temp 98.7°F | Ht 69.0 in | Wt 214.1 lb

## 2015-07-25 DIAGNOSIS — J329 Chronic sinusitis, unspecified: Secondary | ICD-10-CM | POA: Diagnosis not present

## 2015-07-25 MED ORDER — AMOXICILLIN-POT CLAVULANATE 875-125 MG PO TABS: 1.0000 | ORAL_TABLET | Freq: Two times a day (BID) | ORAL | Status: DC

## 2015-07-25 NOTE — Progress Notes (Signed)
   Subjective:    Patient ID: Jeffrey Compton, male    DOB: 11/04/1980, 35 y.o.   MRN: [id number]  Sinusitis This is a new problem. The current episode started in the past 7 days. The problem is unchanged. There has been no fever. The pain is moderate. Associated symptoms include congestion, headaches and sinus pressure. Past treatments include nothing. The treatment provided no relief.   Sore throat raw,   Worse and cong esti  Sinus pressure  Flu like  Patient states that he removed a tick off his body about 5 days ago. , snmall tick , No rash no measurable fever no diffuse headache    Review of Systems  HENT: Positive for congestion and sinus pressure.   Neurological: Positive for headaches.       Objective:   Physical Exam  Alert, mild malaise. Hydration good Vitals stable. frontal/ maxillary tenderness evident positive nasal congestion. pharynx normal neck supple  lungs clear/no crackles or wheezes. heart regular in rhythm       Assessment & Plan:  Impression rhinosinusitis likely post viral, discussed with patient. plan antibiotics prescribed. Questions answered. Symptomatic care discussed. warning signs discussed. WSL

## 2015-08-29 DIAGNOSIS — H60331 Swimmer's ear, right ear: Secondary | ICD-10-CM | POA: Diagnosis not present

## 2015-10-10 DIAGNOSIS — H608X1 Other otitis externa, right ear: Secondary | ICD-10-CM | POA: Diagnosis not present

## 2015-10-11 DIAGNOSIS — Z6831 Body mass index (BMI) 31.0-31.9, adult: Secondary | ICD-10-CM | POA: Diagnosis not present

## 2015-10-11 DIAGNOSIS — Z1389 Encounter for screening for other disorder: Secondary | ICD-10-CM | POA: Diagnosis not present

## 2015-10-11 DIAGNOSIS — F419 Anxiety disorder, unspecified: Secondary | ICD-10-CM | POA: Diagnosis not present

## 2015-10-11 DIAGNOSIS — E669 Obesity, unspecified: Secondary | ICD-10-CM | POA: Diagnosis not present

## 2015-10-11 DIAGNOSIS — E6609 Other obesity due to excess calories: Secondary | ICD-10-CM | POA: Diagnosis not present

## 2015-10-11 DIAGNOSIS — G4709 Other insomnia: Secondary | ICD-10-CM | POA: Diagnosis not present

## 2015-11-21 DIAGNOSIS — H60331 Swimmer's ear, right ear: Secondary | ICD-10-CM | POA: Diagnosis not present

## 2015-12-19 ENCOUNTER — Ambulatory Visit (INDEPENDENT_AMBULATORY_CARE_PROVIDER_SITE_OTHER): Payer: PPO | Admitting: Podiatry

## 2015-12-19 ENCOUNTER — Encounter: Payer: Self-pay | Admitting: Podiatry

## 2015-12-19 VITALS — BP 123/78 | HR 95 | Resp 16

## 2015-12-19 DIAGNOSIS — B070 Plantar wart: Secondary | ICD-10-CM | POA: Diagnosis not present

## 2015-12-19 DIAGNOSIS — L989 Disorder of the skin and subcutaneous tissue, unspecified: Secondary | ICD-10-CM

## 2015-12-19 NOTE — Progress Notes (Signed)
This patient presents to the office with chief complaint of a painful callus on the outside of his left heel. He says that his been present for months and it is increasingly painful as he walks. He states he experiences sharp, radiating pain noted through the site. He has provided no self treatment nor sought any professional help. He presents the office today for an evaluation and treatment of this condition  GENERAL APPEARANCE: Alert, conversant. Appropriately groomed. No acute distress.  VASCULAR: Pedal pulses are  palpable at  [facility] and PT bilateral.  Capillary refill time is immediate to all digits,  Normal temperature gradient.  Digital hair growth is present bilateral  NEUROLOGIC: sensation is normal to 5.07 monofilament at 5/5 sites bilateral.  Light touch is intact bilateral, Muscle strength normal.  MUSCULOSKELETAL: acceptable muscle strength, tone and stability bilateral.  Intrinsic muscluature intact bilateral.  Rectus appearance of foot and digits noted bilateral.   DERMATOLOGIC: skin color, texture, and turgor are within normal limits.  No preulcerative lesions or ulcers  are seen, no interdigital maceration noted.  No open lesions present.  Digital nails are asymptomatic. No drainage noted. Well defined well circumscribed plantar  lesion with white  cauliflower appearing tissue noted.  Black thrombi noted in the center of this lesion  Benign skin lesion  Verruca left heel  Initial exam. The superficial tissue of the skin lesion was to provided revealing the presence of a benign skin lesion/wart. We discussed treatment which included acid treatment or surgical excision.  He opted to be treated with the acid at this visit. Since he was not prepared for skin surgery for the removal of the wart. He will returns the office in one week at which time we will evaluate the acid treatment.   Gardiner Barefoot DPM

## 2015-12-26 ENCOUNTER — Ambulatory Visit (INDEPENDENT_AMBULATORY_CARE_PROVIDER_SITE_OTHER): Payer: PPO | Admitting: Podiatry

## 2015-12-26 ENCOUNTER — Encounter: Payer: Self-pay | Admitting: Podiatry

## 2015-12-26 VITALS — BP 146/89 | HR 85 | Resp 16

## 2015-12-26 DIAGNOSIS — B070 Plantar wart: Secondary | ICD-10-CM

## 2015-12-26 DIAGNOSIS — L989 Disorder of the skin and subcutaneous tissue, unspecified: Secondary | ICD-10-CM | POA: Diagnosis not present

## 2015-12-26 NOTE — Progress Notes (Signed)
This patient presents to the office with chief complaint of a painful callus on the outside of his left heel. He says that his been present for months and it is increasingly painful as he walks. He was diagnosed with a benign skin lesion/wart, left heel. He was treated with acid and states he had minimal reactivity. He does say that he's had minimal pain and discomfort since I worked on the wart at the last visit one week ago presents the office today for continued evaluation of this condition  GENERAL APPEARANCE: Alert, conversant. Appropriately groomed. No acute distress.  VASCULAR: Pedal pulses are  palpable at  [facility] and PT bilateral.  Capillary refill time is immediate to all digits,  Normal temperature gradient.  Digital hair growth is present bilateral  NEUROLOGIC: sensation is normal to 5.07 monofilament at 5/5 sites bilateral.  Light touch is intact bilateral, Muscle strength normal.  MUSCULOSKELETAL: acceptable muscle strength, tone and stability bilateral.  Intrinsic muscluature intact bilateral.  Rectus appearance of foot and digits noted bilateral.   DERMATOLOGIC: skin color, texture, and turgor are within normal limits.  No preulcerative lesions or ulcers  are seen, no interdigital maceration noted.  No open lesions present.  Digital nails are asymptomatic. No drainage noted. Well defined well circumscribed plantar  lesion with white  cauliflower appearing tissue noted.  Black thrombi noted in the center of this lesion.  Minimal necrotic tissue noted. Therefore, I believe there was minimal reactivity due to the acid  Benign skin lesion  Verruca left heel  ROV  patient states that his pain has markedly diminished. He states he had minimal reactivity and there is no evidence of any necrotic tissue noted at the site of the wart. The wart continues but is less painful at this time. We continued to use the acid for an additional week to see if we could have the acid eradicate the wart.  If the wart  persists we will schedule excision of the wart at that next visit   Gardiner Barefoot DPM

## 2016-01-02 ENCOUNTER — Ambulatory Visit (INDEPENDENT_AMBULATORY_CARE_PROVIDER_SITE_OTHER): Payer: PPO | Admitting: Podiatry

## 2016-01-02 ENCOUNTER — Encounter: Payer: Self-pay | Admitting: Podiatry

## 2016-01-02 VITALS — BP 124/89 | HR 71 | Resp 16

## 2016-01-02 DIAGNOSIS — L989 Disorder of the skin and subcutaneous tissue, unspecified: Secondary | ICD-10-CM | POA: Diagnosis not present

## 2016-01-02 DIAGNOSIS — B070 Plantar wart: Secondary | ICD-10-CM | POA: Diagnosis not present

## 2016-01-02 NOTE — Progress Notes (Signed)
This patient presents to the office with chief complaint of a painful callus on the outside of his left heel. He says he knew there was a strong reaction since he experienced pain in the first few days. He presents to the office today for continued evaluation of the plantar wart. After the application of the canthacur.  GENERAL APPEARANCE: Alert, conversant. Appropriately groomed. No acute distress.  VASCULAR: Pedal pulses are  palpable at  [facility] and PT bilateral.  Capillary refill time is immediate to all digits,  Normal temperature gradient.  Digital hair growth is present bilateral  NEUROLOGIC: sensation is normal to 5.07 monofilament at 5/5 sites bilateral.  Light touch is intact bilateral, Muscle strength normal.  MUSCULOSKELETAL: acceptable muscle strength, tone and stability bilateral.  Intrinsic muscluature intact bilateral.  Rectus appearance of foot and digits noted bilateral.   DERMATOLOGIC: skin color, texture, and turgor are within normal limits.  No preulcerative lesions or ulcers  are seen, no interdigital maceration noted.  No open lesions present.  Digital nails are asymptomatic. No drainage noted. There is a blister formation noted under the wart on the plantar aspect of the left foot  Benign skin lesion  Verruca left heel  ROV  patient returns with a blister formation under the wart on the left heel fluctuance noted on palpation of the wart/callus tissue to buy was performed of the necrotic tissue. Neosporin and dry sterile dressing was applied. Home soaks were given. Patient to return to the office for further treatment as needed   Gardiner Barefoot DPM

## 2016-02-20 DIAGNOSIS — H6123 Impacted cerumen, bilateral: Secondary | ICD-10-CM | POA: Diagnosis not present

## 2016-02-22 ENCOUNTER — Encounter: Payer: Self-pay | Admitting: Family Medicine

## 2016-02-22 ENCOUNTER — Ambulatory Visit (INDEPENDENT_AMBULATORY_CARE_PROVIDER_SITE_OTHER): Payer: PPO | Admitting: Family Medicine

## 2016-02-22 VITALS — BP 110/74 | Ht 69.0 in | Wt 222.0 lb

## 2016-02-22 DIAGNOSIS — J029 Acute pharyngitis, unspecified: Secondary | ICD-10-CM | POA: Diagnosis not present

## 2016-02-22 DIAGNOSIS — J101 Influenza due to other identified influenza virus with other respiratory manifestations: Secondary | ICD-10-CM | POA: Diagnosis not present

## 2016-02-22 LAB — POCT RAPID STREP A (OFFICE): RAPID STREP A SCREEN: NEGATIVE

## 2016-02-22 MED ORDER — OSELTAMIVIR PHOSPHATE 75 MG PO CAPS
75.0000 mg | ORAL_CAPSULE | Freq: Two times a day (BID) | ORAL | 0 refills | Status: AC
Start: 2016-02-22 — End: 2016-02-27

## 2016-02-22 NOTE — Progress Notes (Signed)
   Subjective:    Patient ID: Jeffrey Compton, male    DOB: 1980-07-05, 36 y.o.   MRN: [id number]  Sore Throat   This is a new problem. The current episode started yesterday. Associated symptoms comments: Fever, sore throat. He has tried nothing for the symptoms.   Results for orders placed or performed in visit on 02/22/16  POCT rapid strep A  Result Value Ref Range   Rapid Strep A Screen Negative Negative     Fever last felt wam  Throat sore today   Diffuse headache  No runny nose cong  Slight cough,   Rather sudden onset of symptoms  Review of Systems No vomiting no diarrhea no rash    Objective:   Physical Exam  Alert moderate malaise some nasal congestion neck supple. Lungs clear. Heart rare rhythm.      Assessment & Plan:  Impression influenza discussed plan Tamiflu twice a day 5 days. Symptom care discussed warning signs discussed

## 2016-02-23 LAB — STREP A DNA PROBE: STREP GP A DIRECT, DNA PROBE: NEGATIVE

## 2016-03-24 ENCOUNTER — Ambulatory Visit (INDEPENDENT_AMBULATORY_CARE_PROVIDER_SITE_OTHER): Payer: PPO | Admitting: Otolaryngology

## 2016-03-24 DIAGNOSIS — H60331 Swimmer's ear, right ear: Secondary | ICD-10-CM | POA: Diagnosis not present

## 2016-04-10 ENCOUNTER — Ambulatory Visit (INDEPENDENT_AMBULATORY_CARE_PROVIDER_SITE_OTHER): Payer: PPO | Admitting: Otolaryngology

## 2016-04-10 DIAGNOSIS — H60331 Swimmer's ear, right ear: Secondary | ICD-10-CM | POA: Diagnosis not present

## 2016-04-10 DIAGNOSIS — H60391 Other infective otitis externa, right ear: Secondary | ICD-10-CM | POA: Diagnosis not present

## 2016-06-17 DIAGNOSIS — H60331 Swimmer's ear, right ear: Secondary | ICD-10-CM | POA: Diagnosis not present

## 2016-07-21 ENCOUNTER — Encounter: Payer: Self-pay | Admitting: Family Medicine

## 2016-07-21 ENCOUNTER — Ambulatory Visit (INDEPENDENT_AMBULATORY_CARE_PROVIDER_SITE_OTHER): Payer: PPO | Admitting: Family Medicine

## 2016-07-21 VITALS — BP 120/74 | Ht 69.5 in | Wt 218.0 lb

## 2016-07-21 DIAGNOSIS — Z0000 Encounter for general adult medical examination without abnormal findings: Secondary | ICD-10-CM

## 2016-07-21 DIAGNOSIS — F320 Major depressive disorder, single episode, mild: Secondary | ICD-10-CM | POA: Diagnosis not present

## 2016-07-21 MED ORDER — SERTRALINE HCL 50 MG PO TABS: 50.0000 mg | ORAL_TABLET | Freq: Every day | ORAL | 2 refills | Status: DC

## 2016-07-21 NOTE — Patient Instructions (Signed)
Major Depressive Disorder, Adult Major depressive disorder (MDD) is a mental health condition. It may also be called clinical depression or unipolar depression. MDD usually causes feelings of sadness, hopelessness, or helplessness. MDD can also cause physical symptoms. It can interfere with work, school, relationships, and other everyday activities. MDD may be mild, moderate, or severe. It may occur once (single episode major depressive disorder) or it may occur multiple times (recurrent major depressive disorder). What are the causes? The exact cause of this condition is not known. MDD is most likely caused by a combination of things, which may include:  Genetic factors. These are traits that are passed along from parent to child.  Individual factors. Your personality, your behavior, and the way you handle your thoughts and feelings may contribute to MDD. This includes personality traits and behaviors learned from others.  Physical factors, such as: ? Differences in the part of your brain that controls emotion. This part of your brain may be different than it is in people who do not have MDD. ? Long-term (chronic) medical or psychiatric illnesses.  Social factors. Traumatic experiences or major life changes may play a role in the development of MDD.  What increases the risk? This condition is more likely to develop in women. The following factors may also make you more likely to develop MDD:  A family history of depression.  Troubled family relationships.  Abnormally low levels of certain brain chemicals.  Traumatic events in childhood, especially abuse or the loss of a parent.  Being under a lot of stress, or long-term stress, especially from upsetting life experiences or losses.  A history of: ? Chronic physical illness. ? Other mental health disorders. ? Substance abuse.  Poor living conditions.  Experiencing social exclusion or discrimination on a regular basis.  What are  the signs or symptoms? The main symptoms of MDD typically include:  Constant depressed or irritable mood.  Loss of interest in things and activities.  MDD symptoms may also include:  Sleeping or eating too much or too little.  Unexplained weight change.  Fatigue or low energy.  Feelings of worthlessness or guilt.  Difficulty thinking clearly or making decisions.  Thoughts of suicide or of harming others.  Physical agitation or weakness.  Isolation.  Severe cases of MDD may also occur with other symptoms, such as:  Delusions or hallucinations, in which you imagine things that are not real (psychotic depression).  Low-level depression that lasts at least a year (chronic depression or persistent depressive disorder).  Extreme sadness and hopelessness (melancholic depression).  Trouble speaking and moving (catatonic depression).  How is this diagnosed? This condition may be diagnosed based on:  Your symptoms.  Your medical history, including your mental health history. This may involve tests to evaluate your mental health. You may be asked questions about your lifestyle, including any drug and alcohol use, and how long you have had symptoms of MDD.  A physical exam.  Blood tests to rule out other conditions.  You must have a depressed mood and at least four other MDD symptoms most of the day, nearly every day in the same 2-week timeframe before your health care provider can confirm a diagnosis of MDD. How is this treated? This condition is usually treated by mental health professionals, such as psychologists, psychiatrists, and clinical social workers. You may need more than one type of treatment. Treatment may include:  Psychotherapy. This is also called talk therapy or counseling. Types of psychotherapy include: ? Cognitive behavioral   therapy (CBT). This type of therapy teaches you to recognize unhealthy feelings, thoughts, and behaviors, and replace them with  positive thoughts and actions. ? Interpersonal therapy (IPT). This helps you to improve the way you relate to and communicate with others. ? Family therapy. This treatment includes members of your family.  Medicine to treat anxiety and depression, or to help you control certain emotions and behaviors.  Lifestyle changes, such as: ? Limiting alcohol and drug use. ? Exercising regularly. ? Getting plenty of sleep. ? Making healthy eating choices. ? Spending more time outdoors.  Treatments involving stimulation of the brain can be used in situations with extremely severe symptoms, or when medicine or other therapies do not work over time. These treatments include electroconvulsive therapy, transcranial magnetic stimulation, and vagal nerve stimulation. Follow these instructions at home: Activity  Return to your normal activities as told by your health care provider.  Exercise regularly and spend time outdoors as told by your health care provider. General instructions  Take over-the-counter and prescription medicines only as told by your health care provider.  Do not drink alcohol. If you drink alcohol, limit your alcohol intake to no more than 1 drink a day for nonpregnant women and 2 drinks a day for men. One drink equals 12 oz of beer, 5 oz of wine, or 1 oz of hard liquor. Alcohol can affect any antidepressant medicines you are taking. Talk to your health care provider about your alcohol use.  Eat a healthy diet and get plenty of sleep.  Find activities that you enjoy doing, and make time to do them.  Consider joining a support group. Your health care provider may be able to recommend a support group.  Keep all follow-up visits as told by your health care provider. This is important. Where to find more information: National Alliance on Mental Illness  www.nami.org  U.[place]. National Institute of Mental Health  www.nimh.nih.gov  National Suicide Prevention  Lifeline  1-800-273-TALK (8255). This is free, 24-hour help.  Contact a health care provider if:  Your symptoms get worse.  You develop new symptoms. Get help right away if:  You self-harm.  You have serious thoughts about hurting yourself or others.  You see, hear, taste, smell, or feel things that are not present (hallucinate). This information is not intended to replace advice given to you by your health care provider. Make sure you discuss any questions you have with your health care provider. Document Released: 05/03/2012 Document Revised: 09/13/2015 Document Reviewed: 07/18/2015 Elsevier Interactive Patient Education  2017 Elsevier Inc.  

## 2016-07-21 NOTE — Progress Notes (Signed)
Subjective:    Patient ID: Jeffrey Compton, male    DOB: 12/09/1980, 36 y.o.   MRN: [id number]  HPI The patient comes in today for a wellness visit.    A review of their health history was completed.  A review of medications was also completed.  Any needed refills; N/A  Eating habits:  Patient states eating habits are healthy. Has cut back on a lot of foods.   Falls/  MVA accidents in past few months: None   Regular exercise: Patient states exercises 3-4 times per week.  Specialist pt sees on regular basis: ENT Dr.Teoh  Preventative health issues were discussed.   Additional concerns: Patient states no other concerns this visit.    Review of Systems  Constitutional: Negative for activity change, appetite change and fever.  HENT: Negative for congestion and rhinorrhea.   Eyes: Negative for discharge.  Respiratory: Negative for cough and wheezing.   Cardiovascular: Negative for chest pain.  Gastrointestinal: Negative for abdominal pain, blood in stool and vomiting.  Genitourinary: Negative for difficulty urinating and frequency.  Musculoskeletal: Negative for neck pain.  Skin: Negative for rash.  Allergic/Immunologic: Negative for environmental allergies and food allergies.  Neurological: Negative for weakness and headaches.  Psychiatric/Behavioral: Negative for agitation.       Objective:   Physical Exam  Constitutional: He appears well-developed and well-nourished.  HENT:  Head: Normocephalic and atraumatic.  Right Ear: External ear normal.  Left Ear: External ear normal.  Nose: Nose normal.  Mouth/Throat: Oropharynx is clear and moist.  Eyes: EOM are normal. Pupils are equal, round, and reactive to light.  Neck: Normal range of motion. Neck supple. No thyromegaly present.  Cardiovascular: Normal rate, regular rhythm and normal heart sounds.   No murmur heard. Pulmonary/Chest: Effort normal and breath sounds normal. No respiratory distress. He has no  wheezes.  Abdominal: Soft. Bowel sounds are normal. He exhibits no distension and no mass. There is no tenderness.  Genitourinary: Penis normal.  Musculoskeletal: Normal range of motion. He exhibits no edema.  Lymphadenopathy:    He has no cervical adenopathy.  Neurological: He is alert. He exhibits normal muscle tone.  Skin: Skin is warm and dry. No erythema.  Psychiatric: He has a normal mood and affect. His behavior is normal. Judgment normal.    Patient denies smoking only drinks occasionally denies abusing alcohol Does relate some depression over the past couple months Findings stop becoming more isolated Depression questionnaire filled out Patient not suicidal  Patient does feel he is having significant ADD issues he is had this all his life dating back into grade school years      Assessment & Plan:  Probable ADD-Asian is to fill in a questionnaire we may opt to start medication to help him in this area.  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 He is also somewhat stressed because he has Asperger's and it makes it difficult for him to be around large crowds of people so therefore he tends to stay isolated he does go to church I recommended to start Zoloft 50 mg one a day patient not suicidal he is aware that if he starts feeling suicidal to immediately be checked here or ER  Adult wellness-complete.wellness physical was conducted today. Importance of diet and exercise were discussed in detail. In addition to this a discussion regarding safety was also covered. We also reviewed over immunizations and gave recommendations regarding current immunization needed for age. In addition to this additional  areas were also touched on including: Preventative health exams needed: Colonoscopy not indicated  Patient was advised yearly wellness exam Occasional use of alcohol is okay but frequent use would not be a good idea this was discussed in detail  Recheck patient in 2  weeks  Patient does not one to see psychiatry does not one to do counseling

## 2016-07-22 ENCOUNTER — Telehealth: Payer: Self-pay | Admitting: Family Medicine

## 2016-07-22 DIAGNOSIS — Z0000 Encounter for general adult medical examination without abnormal findings: Secondary | ICD-10-CM | POA: Diagnosis not present

## 2016-07-22 NOTE — Telephone Encounter (Signed)
Pt dropped off adhd self reporting scale. In folder in office.

## 2016-07-23 ENCOUNTER — Encounter: Payer: Self-pay | Admitting: Family Medicine

## 2016-07-23 LAB — BASIC METABOLIC PANEL
BUN/Creatinine Ratio: 12 (ref 9–20)
BUN: 12 mg/dL (ref 6–20)
CO2: 24 mmol/L (ref 20–29)
CREATININE: 1.01 mg/dL (ref 0.76–1.27)
Calcium: 9.5 mg/dL (ref 8.7–10.2)
Chloride: 101 mmol/L (ref 96–106)
GFR, EST AFRICAN AMERICAN: 112 mL/min/{1.73_m2} (ref >59)
GFR, EST NON AFRICAN AMERICAN: 97 mL/min/{1.73_m2} (ref >59)
Glucose: 89 mg/dL (ref 65–99)
Potassium: 4.6 mmol/L (ref 3.5–5.2)
Sodium: 141 mmol/L (ref 134–144)

## 2016-07-23 LAB — LIPID PANEL
Chol/HDL Ratio: 9.6 ratio — ABNORMAL HIGH (ref 0.0–5.0)
Cholesterol, Total: 278 mg/dL — ABNORMAL HIGH (ref 100–199)
HDL: 29 mg/dL — ABNORMAL LOW (ref >39)
Triglycerides: 401 mg/dL — ABNORMAL HIGH (ref 0–149)

## 2016-07-23 LAB — HEPATIC FUNCTION PANEL
ALBUMIN: 4.4 g/dL (ref 3.5–5.5)
ALT: 43 IU/L (ref 0–44)
AST: 33 IU/L (ref 0–40)
Alkaline Phosphatase: 60 IU/L (ref 39–117)
BILIRUBIN TOTAL: 0.6 mg/dL (ref 0.0–1.2)
BILIRUBIN, DIRECT: 0.11 mg/dL (ref 0.00–0.40)
TOTAL PROTEIN: 7.1 g/dL (ref 6.0–8.5)

## 2016-07-23 NOTE — Telephone Encounter (Signed)
Based on the questionnaire does appear that the patient does have ADD. Let the patient know that we will discuss potential medications when he does his follow-up in a couple weeks.

## 2016-07-24 NOTE — Telephone Encounter (Signed)
Patient notified and verbalized understanding. 

## 2016-07-24 NOTE — Telephone Encounter (Signed)
Left message to return call 

## 2016-08-04 ENCOUNTER — Ambulatory Visit (INDEPENDENT_AMBULATORY_CARE_PROVIDER_SITE_OTHER): Payer: PPO | Admitting: Family Medicine

## 2016-08-04 ENCOUNTER — Encounter: Payer: Self-pay | Admitting: Family Medicine

## 2016-08-04 VITALS — BP 136/88 | Ht 69.5 in | Wt 219.0 lb

## 2016-08-04 DIAGNOSIS — F320 Major depressive disorder, single episode, mild: Secondary | ICD-10-CM | POA: Diagnosis not present

## 2016-08-04 DIAGNOSIS — F4010 Social phobia, unspecified: Secondary | ICD-10-CM | POA: Diagnosis not present

## 2016-08-04 MED ORDER — SERTRALINE HCL 100 MG PO TABS: 100.0000 mg | ORAL_TABLET | Freq: Every day | ORAL | 4 refills | Status: DC

## 2016-08-04 NOTE — Progress Notes (Signed)
   Subjective:    Patient ID: Jeffrey Compton, male    DOB: 09/17/1980, 36 y.o.   MRN: [id number]  HPIFollow up on depression. Taking zoloft.   Follow up on lab results.   Pt states no concerns today.  Long discussion held today patient states his moods are fair denies being suicidal lines himself anxious at times but not severe feels that his social phobias improving some degree   Review of Systems     Objective:   Physical Exam   Lungs clear heart regular pulse normal     Assessment & Plan:  Depression-I didn't expect the patient to be improved at this point but he does show some improvement with social anxiety in addition to this patient tolerating the medicine well therefore we will continue the current medication we will have the patient follow-up if ongoing troubles or if worse otherwise recheck him in 3 months

## 2016-09-04 ENCOUNTER — Ambulatory Visit (INDEPENDENT_AMBULATORY_CARE_PROVIDER_SITE_OTHER): Payer: PPO | Admitting: Otolaryngology

## 2016-09-04 DIAGNOSIS — H6121 Impacted cerumen, right ear: Secondary | ICD-10-CM | POA: Diagnosis not present

## 2016-09-19 ENCOUNTER — Telehealth: Payer: Self-pay | Admitting: Family Medicine

## 2016-09-19 NOTE — Telephone Encounter (Signed)
Pt left message on my voicemail 09/17/16-wants referral back to Dr. Reece Levy (has seen before, years ago)  Called pt back, LMOVM - referral not required, Dr. Reddy's office requires the patient to call & schedule, gave patient phone number to Dr. Ephriam Jenkins office

## 2016-10-15 DIAGNOSIS — H6123 Impacted cerumen, bilateral: Secondary | ICD-10-CM | POA: Diagnosis not present

## 2016-10-15 DIAGNOSIS — H60331 Swimmer's ear, right ear: Secondary | ICD-10-CM | POA: Diagnosis not present

## 2016-10-22 DIAGNOSIS — F4010 Social phobia, unspecified: Secondary | ICD-10-CM | POA: Diagnosis not present

## 2016-10-22 DIAGNOSIS — Z6831 Body mass index (BMI) 31.0-31.9, adult: Secondary | ICD-10-CM | POA: Diagnosis not present

## 2016-10-22 DIAGNOSIS — F419 Anxiety disorder, unspecified: Secondary | ICD-10-CM | POA: Diagnosis not present

## 2016-10-22 DIAGNOSIS — Z1389 Encounter for screening for other disorder: Secondary | ICD-10-CM | POA: Diagnosis not present

## 2016-10-22 DIAGNOSIS — E6609 Other obesity due to excess calories: Secondary | ICD-10-CM | POA: Diagnosis not present

## 2016-11-07 IMAGING — DX DG TIBIA/FIBULA 2V*R*
2 series · 2 of 2 positions shown · non-contrast
Comparison: None.

CLINICAL DATA: Chain saw laceration to the anterior aspect of the
right lower leg today.

EXAM:
RIGHT TIBIA AND FIBULA - 2 VIEW

[tibia ap]
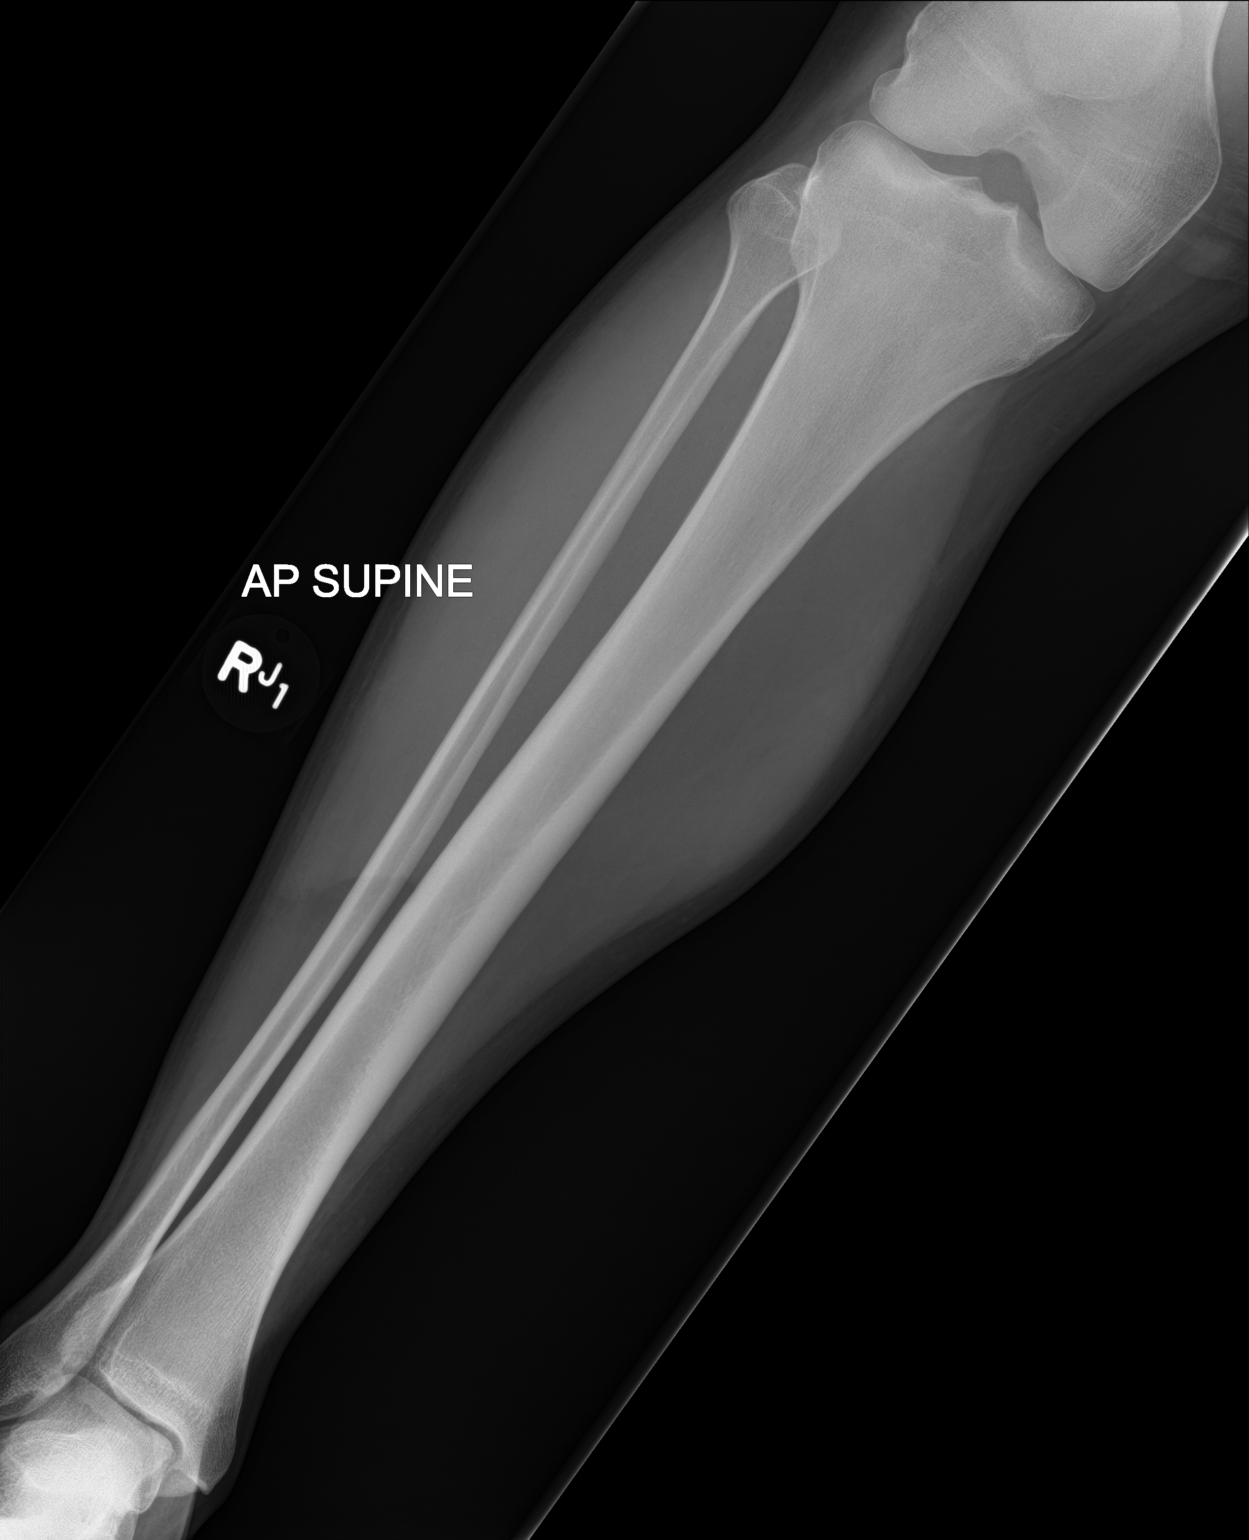

[tibia lat]
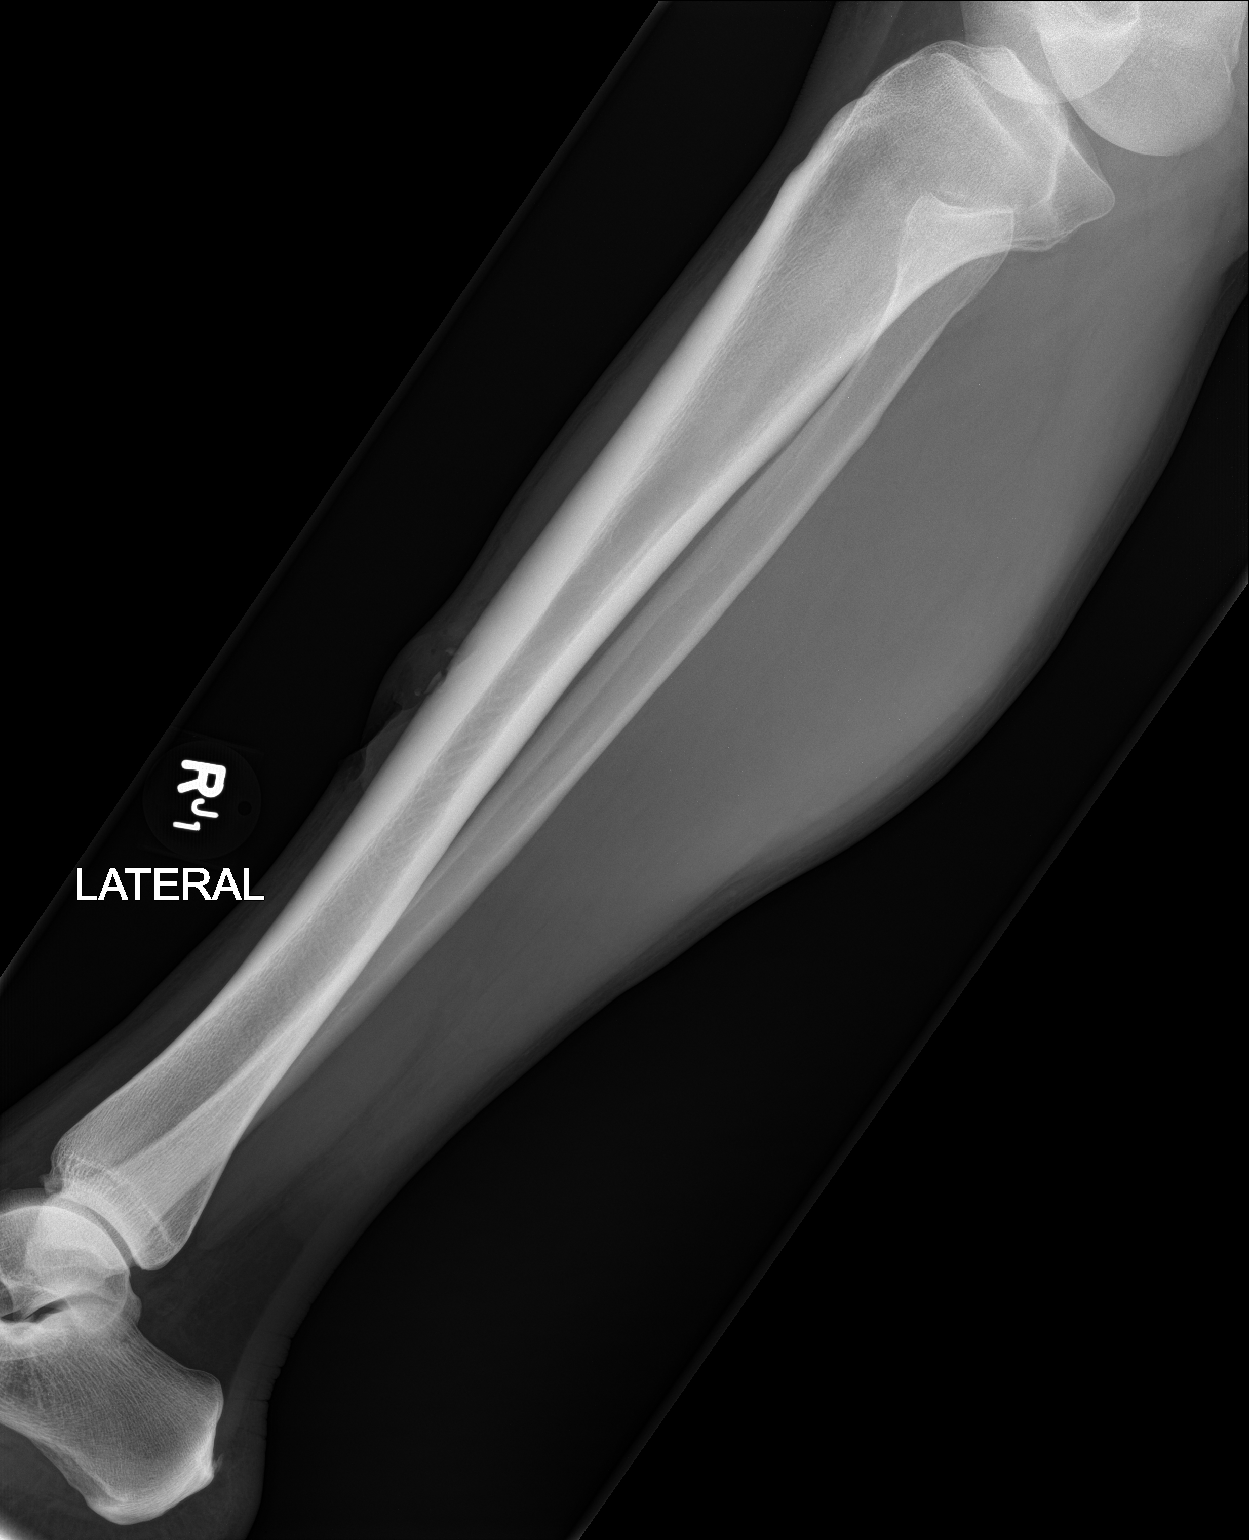

[2 of 2 positions shown; findings below may reference images not displayed]

FINDINGS: There is an irregular laceration of the anterior aspect of the right
lower leg with multiple small bone fragments avulsed from the
anterior cortex of the tibia. There is no fracture through the
cortex. Fibula is normal.
IMPRESSION: Multiple small avulsions of bone from the anterior cortex of the mid
tibia.

## 2016-11-18 ENCOUNTER — Ambulatory Visit: Payer: PPO | Admitting: Family Medicine

## 2017-02-17 DIAGNOSIS — H6123 Impacted cerumen, bilateral: Secondary | ICD-10-CM | POA: Diagnosis not present

## 2017-04-10 ENCOUNTER — Ambulatory Visit (INDEPENDENT_AMBULATORY_CARE_PROVIDER_SITE_OTHER): Payer: PPO | Admitting: Family Medicine

## 2017-04-10 ENCOUNTER — Encounter: Payer: Self-pay | Admitting: Family Medicine

## 2017-04-10 VITALS — BP 128/72 | Temp 101.0°F | Ht 69.5 in | Wt 211.0 lb

## 2017-04-10 DIAGNOSIS — J111 Influenza due to unidentified influenza virus with other respiratory manifestations: Secondary | ICD-10-CM | POA: Diagnosis not present

## 2017-04-10 MED ORDER — OSELTAMIVIR PHOSPHATE 75 MG PO CAPS: 75.0000 mg | ORAL_CAPSULE | Freq: Two times a day (BID) | ORAL | 0 refills | Status: AC

## 2017-04-10 NOTE — Progress Notes (Signed)
   Subjective:    Patient ID: Jeffrey Compton, male    DOB: [birth year], 37 y.o.   MRN: [id number]  HPI  Patient is here today with complaints of body ahes and fever,and a headache since this am.He has not taken any medications for this.  Hit hard and suddenly  So far no meds  Headache , whole head and achey elsewhere     Felt real achey   Zero appetite   Zero energy  Slight cpugh at most     Review of Systems No headache, no major weight loss or weight gain, no chest pain no back pain abdominal pain no change in bowel habits complete ROS otherwise negative     Objective:   Physical Exam  Alert vitals reviewed, moderate malaise. Hydration good. Positive nasal congestion lungs no crackles or wheezes, no tachypnea, intermittent bronchial cough during exam heart regular rate and rhythm.       Assessment & Plan:  Impression influenza discussed at length. Petra Kuba of illness and potential sequela discussed. Plan Tamiflu prescribed if indicated and timing appropriate. Symptom care discussed. Warning signs discussed. WSL

## 2017-04-14 DIAGNOSIS — H6123 Impacted cerumen, bilateral: Secondary | ICD-10-CM | POA: Diagnosis not present

## 2017-04-14 DIAGNOSIS — H5203 Hypermetropia, bilateral: Secondary | ICD-10-CM | POA: Diagnosis not present

## 2017-04-14 DIAGNOSIS — H52223 Regular astigmatism, bilateral: Secondary | ICD-10-CM | POA: Diagnosis not present

## 2017-04-27 ENCOUNTER — Encounter: Payer: Self-pay | Admitting: Family Medicine

## 2017-04-27 ENCOUNTER — Ambulatory Visit (INDEPENDENT_AMBULATORY_CARE_PROVIDER_SITE_OTHER): Payer: PPO | Admitting: Family Medicine

## 2017-04-27 VITALS — BP 118/78 | Ht 69.5 in | Wt 214.3 lb

## 2017-04-27 DIAGNOSIS — E7849 Other hyperlipidemia: Secondary | ICD-10-CM | POA: Diagnosis not present

## 2017-04-27 DIAGNOSIS — R252 Cramp and spasm: Secondary | ICD-10-CM | POA: Diagnosis not present

## 2017-04-27 MED ORDER — TIZANIDINE HCL 2 MG PO CAPS: ORAL_CAPSULE | ORAL | 1 refills | Status: DC

## 2017-04-27 NOTE — Progress Notes (Signed)
   Subjective:    Patient ID: Jeffrey Compton, male    DOB: August 21, 1980, 37 y.o.   MRN: [id number]  HPI  Patient arrives with right leg pain and numbness for 2-3 weeks. Patient reports a previous chainsaw injury to that leg.   Cramps Past few weeks Foot numbness and intermit pain Previous injury a few years ago Review of Systems  Constitutional: Negative for activity change, fatigue and fever.  HENT: Negative for congestion and rhinorrhea.   Respiratory: Negative for cough and shortness of breath.   Cardiovascular: Negative for chest pain and leg swelling.  Gastrointestinal: Negative for abdominal pain, diarrhea and nausea.  Genitourinary: Negative for dysuria and hematuria.  Neurological: Negative for weakness and headaches.  Psychiatric/Behavioral: Negative for behavioral problems.       Objective:   Physical Exam  Constitutional: He appears well-nourished. No distress.  HENT:  Head: Normocephalic and atraumatic.  Eyes: Right eye exhibits no discharge. Left eye exhibits no discharge.  Neck: No tracheal deviation present.  Cardiovascular: Normal rate, regular rhythm and normal heart sounds.  No murmur heard. Pulmonary/Chest: Effort normal and breath sounds normal. No respiratory distress. He has no wheezes.  Musculoskeletal: He exhibits no edema.  Lymphadenopathy:    He has no cervical adenopathy.  Neurological: He is alert.  Skin: Skin is warm. No rash noted.  Psychiatric: His behavior is normal.  Vitals reviewed.   Lower leg scar noted no hypersensitivity around the calf nontender      Assessment & Plan:  Neuropathic pain Hold off on gabapentin  the benefits currently do not outweigh the side effects patient was encouraged if he gets worse to notify us  Muscle cramps check lab work check magnesium metabolic 7 also Zanaflex as needed stretching exercises follow-up if problems wellness checkup on a regular basis

## 2017-04-28 DIAGNOSIS — H52223 Regular astigmatism, bilateral: Secondary | ICD-10-CM | POA: Diagnosis not present

## 2017-04-28 DIAGNOSIS — Z961 Presence of intraocular lens: Secondary | ICD-10-CM | POA: Diagnosis not present

## 2017-05-04 DIAGNOSIS — H6123 Impacted cerumen, bilateral: Secondary | ICD-10-CM | POA: Diagnosis not present

## 2017-05-04 DIAGNOSIS — H60333 Swimmer's ear, bilateral: Secondary | ICD-10-CM | POA: Diagnosis not present

## 2017-06-08 DIAGNOSIS — F909 Attention-deficit hyperactivity disorder, unspecified type: Secondary | ICD-10-CM | POA: Diagnosis not present

## 2017-06-08 DIAGNOSIS — Z6831 Body mass index (BMI) 31.0-31.9, adult: Secondary | ICD-10-CM | POA: Diagnosis not present

## 2017-06-08 DIAGNOSIS — Z1389 Encounter for screening for other disorder: Secondary | ICD-10-CM | POA: Diagnosis not present

## 2017-06-08 DIAGNOSIS — E6609 Other obesity due to excess calories: Secondary | ICD-10-CM | POA: Diagnosis not present

## 2017-06-19 DIAGNOSIS — H60333 Swimmer's ear, bilateral: Secondary | ICD-10-CM | POA: Diagnosis not present

## 2017-06-19 DIAGNOSIS — H6123 Impacted cerumen, bilateral: Secondary | ICD-10-CM | POA: Diagnosis not present

## 2017-06-24 ENCOUNTER — Ambulatory Visit (INDEPENDENT_AMBULATORY_CARE_PROVIDER_SITE_OTHER): Payer: PPO | Admitting: Family Medicine

## 2017-06-24 ENCOUNTER — Encounter: Payer: Self-pay | Admitting: Family Medicine

## 2017-06-24 VITALS — BP 118/78 | Temp 97.8°F | Ht 69.5 in | Wt 212.2 lb

## 2017-06-24 DIAGNOSIS — L03115 Cellulitis of right lower limb: Secondary | ICD-10-CM

## 2017-06-24 MED ORDER — DOXYCYCLINE HYCLATE 100 MG PO CAPS: 100.0000 mg | ORAL_CAPSULE | Freq: Two times a day (BID) | ORAL | 0 refills | Status: DC

## 2017-06-24 NOTE — Progress Notes (Signed)
   Subjective:    Patient ID: Jeffrey Compton, male    DOB: 12-11-1980, 37 y.o.   MRN: [id number]  HPI  Patient arrives with a sore on his right leg for a few weeks. The patient states it started as a small red bump then it started becoming more inflamed than a little bit larger now causing some pain and discomfort he wonders if it is an abscess.  Denies it draining anything.  Denies any injury to it.  Is never had anything like this before.  It is in the right knee region.  He is not using anything on it currently.  Denies any fever chills sweats Review of Systems    Please see above Objective:   Physical Exam The upper leg and lower leg ankle left leg all appear normal around the right knee there is a erythematous area approximately 1 inch in diameter with a swollen middle which is not fluctuant it is not an abscess but there is cellulitis       Assessment & Plan:  Probable MRSA infection Warm compresses frequently Antibiotics prescribed warning signs discussed follow-up if problems No abscess Do not need incision and drainage currently warning signs for this was discussed

## 2017-07-01 ENCOUNTER — Encounter: Payer: Self-pay | Admitting: Family Medicine

## 2017-07-01 ENCOUNTER — Ambulatory Visit (INDEPENDENT_AMBULATORY_CARE_PROVIDER_SITE_OTHER): Payer: PPO | Admitting: Family Medicine

## 2017-07-01 VITALS — BP 132/86 | Temp 98.5°F | Ht 69.5 in | Wt 214.0 lb

## 2017-07-01 DIAGNOSIS — M79661 Pain in right lower leg: Secondary | ICD-10-CM | POA: Diagnosis not present

## 2017-07-01 DIAGNOSIS — T148XXA Other injury of unspecified body region, initial encounter: Secondary | ICD-10-CM

## 2017-07-01 DIAGNOSIS — L03115 Cellulitis of right lower limb: Secondary | ICD-10-CM

## 2017-07-01 NOTE — Progress Notes (Signed)
   Subjective:    Patient ID: Jeffrey Compton, male    DOB: 11-16-1980, 37 y.o.   MRN: [id number]  HPI Pt here for leg infection. Pt was here last week and provider stated that if it became worse to come back. Pt now has rash that started today. Is taking Doxycycline and advil.  Patient's area of infection is doing much better The area that his rashAppears green with a little bit of purple Its on the inner aspect of the upper leg No calf tenderness swelling or pain with walking  Review of Systems Denies any pain with walking denies swelling denies new injury denies fever    Objective:   Physical Exam Upper thigh normal knee is normal the area of the cellulitis is healed up He does have what appears to be bruised with a purple area any yellow-green area he denies any significant tenderness denies injury to the area the calf is normal the ankle normal no swelling  No other rashes noted     Assessment & Plan:  Bruising-should gradually go away on its own no need for any intervention if he starts getting bruising in other places for no apparent reason the follow-up immediately no other bruising is noted

## 2017-07-14 ENCOUNTER — Encounter: Payer: Self-pay | Admitting: Family Medicine

## 2017-07-14 ENCOUNTER — Ambulatory Visit (INDEPENDENT_AMBULATORY_CARE_PROVIDER_SITE_OTHER): Payer: PPO | Admitting: Family Medicine

## 2017-07-14 VITALS — BP 112/72 | Ht 69.5 in | Wt 215.0 lb

## 2017-07-14 DIAGNOSIS — Z79899 Other long term (current) drug therapy: Secondary | ICD-10-CM | POA: Diagnosis not present

## 2017-07-14 DIAGNOSIS — R5383 Other fatigue: Secondary | ICD-10-CM

## 2017-07-14 DIAGNOSIS — F339 Major depressive disorder, recurrent, unspecified: Secondary | ICD-10-CM | POA: Diagnosis not present

## 2017-07-14 DIAGNOSIS — Z13220 Encounter for screening for lipoid disorders: Secondary | ICD-10-CM | POA: Diagnosis not present

## 2017-07-14 NOTE — Progress Notes (Signed)
   Subjective:    Patient ID: Jeffrey Compton, male    DOB: Dec 30, 1980, 37 y.o.   MRN: [id number]  HPI  Patient is here today to discuss getting a referral to a physiatrist to help with depression and anxiety. He states he does not want to harm himself or others.He is no longer on Zoloft, he took himself off of it a while back,maybe a year ago. Patient relates a moderate amount depression and anxiety this is been intermittent plus also has a mother who has similar symptoms Review of Systems Denies any chest tightness pressure pain shortness of breath    Objective:   Physical Exam PHQ reviewed GAD reviewed Lungs clear heart regular HEENT benign no murmurs respiratory rate normal       Assessment & Plan:  Depression Consultation with psychiatry and counseling in [facility] Follow-up Wednesday

## 2017-07-22 ENCOUNTER — Encounter: Payer: Self-pay | Admitting: Family Medicine

## 2017-07-28 DIAGNOSIS — H6123 Impacted cerumen, bilateral: Secondary | ICD-10-CM | POA: Diagnosis not present

## 2017-07-28 DIAGNOSIS — H60333 Swimmer's ear, bilateral: Secondary | ICD-10-CM | POA: Diagnosis not present

## 2017-09-01 DIAGNOSIS — H6123 Impacted cerumen, bilateral: Secondary | ICD-10-CM | POA: Diagnosis not present

## 2017-09-11 DIAGNOSIS — H6123 Impacted cerumen, bilateral: Secondary | ICD-10-CM | POA: Diagnosis not present

## 2017-09-11 DIAGNOSIS — H60333 Swimmer's ear, bilateral: Secondary | ICD-10-CM | POA: Diagnosis not present

## 2017-10-06 ENCOUNTER — Encounter: Payer: Self-pay | Admitting: Family Medicine

## 2017-10-06 ENCOUNTER — Ambulatory Visit (INDEPENDENT_AMBULATORY_CARE_PROVIDER_SITE_OTHER): Payer: PPO | Admitting: Family Medicine

## 2017-10-06 VITALS — BP 118/76 | Ht 69.5 in | Wt 220.0 lb

## 2017-10-06 DIAGNOSIS — L03115 Cellulitis of right lower limb: Secondary | ICD-10-CM | POA: Diagnosis not present

## 2017-10-06 MED ORDER — DOXYCYCLINE HYCLATE 100 MG PO TABS: 100.0000 mg | ORAL_TABLET | Freq: Two times a day (BID) | ORAL | 0 refills | Status: DC

## 2017-10-06 MED ORDER — MUPIROCIN 2 % EX OINT: TOPICAL_OINTMENT | CUTANEOUS | 0 refills | Status: DC

## 2017-10-06 NOTE — Progress Notes (Signed)
   Subjective:    Patient ID: Jeffrey Compton, male    DOB: [birth year], 37 y.o.   MRN: [id number]  HPI  Patient is here today with an open wound that he states just opened up on its on. It is located on the right lower leg where he had a chainsaw accident a few years ago. His lower leg area has excoriated area along with some drainage and crusting No other particular troubles are noted No fevers or vomiting  Review of Systems  Constitutional: Negative for activity change, fatigue and fever.  HENT: Negative for congestion and rhinorrhea.   Respiratory: Negative for cough and shortness of breath.   Cardiovascular: Negative for chest pain and leg swelling.  Gastrointestinal: Negative for abdominal pain, diarrhea and nausea.  Genitourinary: Negative for dysuria and hematuria.  Neurological: Negative for weakness and headaches.  Psychiatric/Behavioral: Negative for agitation and behavioral problems.       Objective:   Physical Exam  Constitutional: He appears well-nourished. No distress.  HENT:  Head: Normocephalic and atraumatic.  Eyes: Right eye exhibits no discharge. Left eye exhibits no discharge.  Neck: No tracheal deviation present.  Cardiovascular: Normal rate, regular rhythm and normal heart sounds.  No murmur heard. Pulmonary/Chest: Effort normal and breath sounds normal. No respiratory distress.  Musculoskeletal: He exhibits no edema.  Lymphadenopathy:    He has no cervical adenopathy.  Neurological: He is alert. Coordination normal.  Skin: Skin is warm and dry.  Psychiatric: He has a normal mood and affect. His behavior is normal.  Vitals reviewed.     15 minutes was spent with patient today discussing healthcare issues which they came.  More than 50% of this visit-total duration of visit-was spent in counseling and coordination of care.  Please see diagnosis regarding the focus of this coordination and care     Assessment & Plan:  Skin infection Importance  of cleaning it No sign of foreign body Up-to-date on tetanus Antibiotic prescribed doxycycline 10 days Cleaning daily Bactroban Follow-up if progressive troubles

## 2017-10-09 LAB — WOUND CULTURE

## 2017-10-10 ENCOUNTER — Encounter: Payer: Self-pay | Admitting: Family Medicine

## 2017-10-12 ENCOUNTER — Telehealth: Payer: Self-pay | Admitting: Family Medicine

## 2017-10-12 NOTE — Telephone Encounter (Signed)
Pt calling to request follow up appt this week with Dr. Nicki Reaper concerning his right leg  Only same day slots available - please advise & call pt

## 2017-10-12 NOTE — Telephone Encounter (Signed)
I spoke with the pt and he states he was seen here last week for an old chain saw injury that had re opened. He states he was given antibx and and a cream to place on it. He is not running a fever,but states it looks like it is still infected. He is going out of town this week and wants to know what he is supposed to do about this. [facility] [place].

## 2017-10-12 NOTE — Telephone Encounter (Signed)
He may have a follow-up office visit with me Tuesday or Wednesday

## 2017-10-15 NOTE — Telephone Encounter (Signed)
LVM for pt to call back and get an appt with Dr. Nicki Reaper tomorrow.

## 2017-10-28 DIAGNOSIS — H6123 Impacted cerumen, bilateral: Secondary | ICD-10-CM | POA: Diagnosis not present

## 2017-11-09 DIAGNOSIS — L918 Other hypertrophic disorders of the skin: Secondary | ICD-10-CM | POA: Diagnosis not present

## 2017-11-09 DIAGNOSIS — D2321 Other benign neoplasm of skin of right ear and external auricular canal: Secondary | ICD-10-CM | POA: Diagnosis not present

## 2017-11-18 DIAGNOSIS — Z029 Encounter for administrative examinations, unspecified: Secondary | ICD-10-CM

## 2017-12-22 DIAGNOSIS — H6993 Unspecified Eustachian tube disorder, bilateral: Secondary | ICD-10-CM | POA: Diagnosis not present

## 2017-12-22 DIAGNOSIS — H6123 Impacted cerumen, bilateral: Secondary | ICD-10-CM | POA: Diagnosis not present

## 2017-12-28 ENCOUNTER — Encounter: Payer: Self-pay | Admitting: Family Medicine

## 2017-12-28 ENCOUNTER — Ambulatory Visit (INDEPENDENT_AMBULATORY_CARE_PROVIDER_SITE_OTHER): Payer: PPO | Admitting: Family Medicine

## 2017-12-28 VITALS — BP 122/70 | Temp 98.3°F | Ht 69.5 in | Wt 224.8 lb

## 2017-12-28 DIAGNOSIS — J329 Chronic sinusitis, unspecified: Secondary | ICD-10-CM

## 2017-12-28 MED ORDER — CLARITHROMYCIN 500 MG PO TABS: 500.0000 mg | ORAL_TABLET | Freq: Two times a day (BID) | ORAL | 0 refills | Status: DC

## 2017-12-28 NOTE — Progress Notes (Signed)
   Subjective:    Patient ID: Jeffrey Compton, male    DOB: 03-18-1980, 37 y.o.   MRN: [id number]  Sinusitis  This is a new problem. The current episode started 1 to 4 weeks ago. Associated symptoms include coughing, ear pain, headaches and sinus pressure. (Wheezing, runny nose, ear drainage) Treatments tried: ear drops. The treatment provided no relief.    Frontal h a, worse with cough Maybe mild fever   Dim energy    Jeffrey Compton  /frontal  No n  Or v  Used some ear drops, drops did not help   Review of Systems  HENT: Positive for ear pain and sinus pressure.   Respiratory: Positive for cough.   Neurological: Positive for headaches.       Objective:   Physical Exam  Alert, mild malaise. Hydration good Vitals stable.  Right external ear inflammation frontal/ maxillary tenderness evident positive nasal congestion. pharynx normal neck supple  lungs clear/no crackles or wheezes. heart regular in rhythm       Assessment & Plan:  Impression rhinosinusitis likely post viral, discussed with patient. plan antibiotics prescribed.  Patient states went to see ENT doctor last week and was told he had external ear infection advised to continue eardrops questions answered. Symptomatic care discussed. warning signs discussed. WSL

## 2018-01-05 DIAGNOSIS — F329 Major depressive disorder, single episode, unspecified: Secondary | ICD-10-CM | POA: Diagnosis not present

## 2018-01-05 DIAGNOSIS — F909 Attention-deficit hyperactivity disorder, unspecified type: Secondary | ICD-10-CM | POA: Diagnosis not present

## 2018-01-05 DIAGNOSIS — Z0001 Encounter for general adult medical examination with abnormal findings: Secondary | ICD-10-CM | POA: Diagnosis not present

## 2018-01-05 DIAGNOSIS — E669 Obesity, unspecified: Secondary | ICD-10-CM | POA: Diagnosis not present

## 2018-01-05 DIAGNOSIS — Z6832 Body mass index (BMI) 32.0-32.9, adult: Secondary | ICD-10-CM | POA: Diagnosis not present

## 2018-01-14 ENCOUNTER — Ambulatory Visit (INDEPENDENT_AMBULATORY_CARE_PROVIDER_SITE_OTHER): Payer: PPO | Admitting: Otolaryngology

## 2018-01-14 DIAGNOSIS — H6123 Impacted cerumen, bilateral: Secondary | ICD-10-CM

## 2018-01-19 ENCOUNTER — Telehealth: Payer: Self-pay | Admitting: Family Medicine

## 2018-01-19 NOTE — Telephone Encounter (Signed)
FYI  Pt called requesting information on how to get some counseling.  Explained to pt that a referral is not required and that he may call the phone number on his insurance card to get a list of participating providers for his area, look over the list, and call their office directly to schedule.  Told pt to let us know if we could be of any further assistance, pt verbalized understanding

## 2018-01-22 ENCOUNTER — Ambulatory Visit (INDEPENDENT_AMBULATORY_CARE_PROVIDER_SITE_OTHER): Payer: Medicare HMO | Admitting: Family Medicine

## 2018-01-22 ENCOUNTER — Encounter: Payer: Self-pay | Admitting: Family Medicine

## 2018-01-22 VITALS — BP 130/78 | Temp 98.1°F | Ht 69.5 in | Wt 220.4 lb

## 2018-01-22 DIAGNOSIS — H6021 Malignant otitis externa, right ear: Secondary | ICD-10-CM

## 2018-01-22 MED ORDER — AMOXICILLIN-POT CLAVULANATE 875-125 MG PO TABS: ORAL_TABLET | ORAL | 0 refills | Status: DC

## 2018-01-22 MED ORDER — NEOMYCIN-POLYMYXIN-HC 3.5-10000-1 OT SOLN: 4.0000 [drp] | Freq: Four times a day (QID) | OTIC | 0 refills | Status: DC

## 2018-01-22 NOTE — Progress Notes (Signed)
   Subjective:    Patient ID: Jeffrey Compton, male    DOB: 06/09/1980, 38 y.o.   MRN: [id number]  Otalgia   There is pain in the right ear. The current episode started more than 1 month ago. Associated symptoms include headaches.  Patient has been dealing with ear challenges off and on for months.  Normally sees a different ENT in [place] approximately yearly to get his ears washed out.  Saw this ENT this summer and had minimal issues along these lines.  Now experiencing recurrent ear pain.  Has received antibiotics and treatment here.   Went to see dr Melene Plan states who prescribed eardrops which helped some.  But did not completely get rid of the problem.  In addition has a high co-pay which patient cannot afford     Review of Systems  HENT: Positive for ear pain.   Neurological: Positive for headaches.       Objective:   Physical Exam  Alert vitals stable, NAD. Blood pressure good on repeat. HEENT normal. Lungs clear. Heart regular rate and rhythm. External canal inflamed crusty tender tympanic membrane somewhat obscured     Assessment & Plan:  Impression external otitis significant.  Complicated by seborrheic dermatitis and fairly small ear canals which are superior to the eardrum.  Will cover both topically and systemically rationale discussed.  Eardrops plus oral antibiotics.  If persists will need to consider to get back to Dr. Leonarda Salon  Greater than 50% of this 15 minute face to face visit was spent in counseling and discussion and coordination of care regarding the above diagnosis/diagnosies

## 2018-02-17 ENCOUNTER — Emergency Department (HOSPITAL_COMMUNITY): Payer: Medicare HMO

## 2018-02-17 ENCOUNTER — Other Ambulatory Visit: Payer: Self-pay

## 2018-02-17 ENCOUNTER — Emergency Department (HOSPITAL_COMMUNITY)
Admission: EM | Admit: 2018-02-17 | Discharge: 2018-02-17 | Disposition: A | Discharge status: Home / Self Care | Payer: Medicare HMO | Attending: Emergency Medicine | Admitting: Emergency Medicine

## 2018-02-17 ENCOUNTER — Encounter (HOSPITAL_COMMUNITY): Payer: Self-pay | Admitting: *Deleted

## 2018-02-17 DIAGNOSIS — Y998 Other external cause status: Secondary | ICD-10-CM | POA: Insufficient documentation

## 2018-02-17 DIAGNOSIS — R000 Tachycardia, unspecified: Secondary | ICD-10-CM | POA: Diagnosis not present

## 2018-02-17 DIAGNOSIS — R55 Syncope and collapse: Secondary | ICD-10-CM | POA: Diagnosis not present

## 2018-02-17 DIAGNOSIS — Y93H9 Activity, other involving exterior property and land maintenance, building and construction: Secondary | ICD-10-CM | POA: Diagnosis not present

## 2018-02-17 DIAGNOSIS — S43005A Unspecified dislocation of left shoulder joint, initial encounter: Secondary | ICD-10-CM | POA: Insufficient documentation

## 2018-02-17 DIAGNOSIS — S43035A Inferior dislocation of left humerus, initial encounter: Secondary | ICD-10-CM | POA: Diagnosis not present

## 2018-02-17 DIAGNOSIS — W010XXA Fall on same level from slipping, tripping and stumbling without subsequent striking against object, initial encounter: Secondary | ICD-10-CM | POA: Diagnosis not present

## 2018-02-17 DIAGNOSIS — R42 Dizziness and giddiness: Secondary | ICD-10-CM | POA: Diagnosis not present

## 2018-02-17 DIAGNOSIS — Y92007 Garden or yard of unspecified non-institutional (private) residence as the place of occurrence of the external cause: Secondary | ICD-10-CM | POA: Insufficient documentation

## 2018-02-17 DIAGNOSIS — W19XXXA Unspecified fall, initial encounter: Secondary | ICD-10-CM

## 2018-02-17 DIAGNOSIS — R52 Pain, unspecified: Secondary | ICD-10-CM | POA: Diagnosis not present

## 2018-02-17 DIAGNOSIS — R410 Disorientation, unspecified: Secondary | ICD-10-CM | POA: Diagnosis not present

## 2018-02-17 LAB — CBC WITH DIFFERENTIAL/PLATELET
Abs Immature Granulocytes: 0.06 10*3/uL (ref 0.00–0.07)
Basophils Absolute: 0 10*3/uL (ref 0.0–0.1)
Basophils Relative: 0 %
Eosinophils Absolute: 0 10*3/uL (ref 0.0–0.5)
Eosinophils Relative: 0 %
HCT: 43.1 % (ref 39.0–52.0)
Hemoglobin: 14.1 g/dL (ref 13.0–17.0)
Immature Granulocytes: 1 %
Lymphocytes Relative: 11 %
Lymphs Abs: 1.2 10*3/uL (ref 0.7–4.0)
MCH: 29 pg (ref 26.0–34.0)
MCHC: 32.7 g/dL (ref 30.0–36.0)
MCV: 88.7 fL (ref 80.0–100.0)
Monocytes Absolute: 0.5 10*3/uL (ref 0.1–1.0)
Monocytes Relative: 5 %
NEUTROS ABS: 9.5 10*3/uL — AB (ref 1.7–7.7)
Neutrophils Relative %: 83 %
Platelets: 253 10*3/uL (ref 150–400)
RBC: 4.86 MIL/uL (ref 4.22–5.81)
RDW: 12.2 % (ref 11.5–15.5)
WBC: 11.3 10*3/uL — ABNORMAL HIGH (ref 4.0–10.5)
nRBC: 0 % (ref 0.0–0.2)

## 2018-02-17 LAB — COMPREHENSIVE METABOLIC PANEL
ALT: 40 U/L (ref 0–44)
AST: 36 U/L (ref 15–41)
Albumin: 4.3 g/dL (ref 3.5–5.0)
Alkaline Phosphatase: 51 U/L (ref 38–126)
Anion gap: 9 (ref 5–15)
BUN: 15 mg/dL (ref 6–20)
CO2: 23 mmol/L (ref 22–32)
CREATININE: 0.88 mg/dL (ref 0.61–1.24)
Calcium: 8.4 mg/dL — ABNORMAL LOW (ref 8.9–10.3)
Chloride: 104 mmol/L (ref 98–111)
GFR calc Af Amer: >60 mL/min (ref >60)
Glucose, Bld: 140 mg/dL — ABNORMAL HIGH (ref 70–99)
Potassium: 3.7 mmol/L (ref 3.5–5.1)
Sodium: 136 mmol/L (ref 135–145)
Total Bilirubin: 0.7 mg/dL (ref 0.3–1.2)
Total Protein: 7.3 g/dL (ref 6.5–8.1)

## 2018-02-17 LAB — CBG MONITORING, ED
GLUCOSE-CAPILLARY: 131 mg/dL — AB (ref 70–99)
Glucose-Capillary: 56 mg/dL — ABNORMAL LOW (ref 70–99)

## 2018-02-17 LAB — TROPONIN I

## 2018-02-17 MED ORDER — MORPHINE SULFATE (PF) 4 MG/ML IV SOLN
4.0000 mg | Freq: Once | INTRAVENOUS | Status: AC
  Administered 2018-02-17: 4 mg via INTRAVENOUS
  Filled 2018-02-17: qty 1

## 2018-02-17 MED ORDER — ETOMIDATE 2 MG/ML IV SOLN
14.0000 mg | Freq: Once | INTRAVENOUS | Status: AC
  Administered 2018-02-17: 14 mg via INTRAVENOUS
  Filled 2018-02-17: qty 10

## 2018-02-17 NOTE — ED Provider Notes (Addendum)
[facility] EMERGENCY DEPARTMENT Provider Note   CSN: [id number] Arrival date & time: 02/17/18  1557     History   Chief Complaint Chief Complaint  Patient presents with  . Fall    HPI Maleak Brazzel Compton is a 38 y.o. male.  Patient states that he did not eat much lunch and then this afternoon he felt stinging in his head and then passed out.  He complains of pain in his left shoulder  The history is provided by the patient. No language interpreter was used.  Fall  This is a new problem. The current episode started 1 to 2 hours ago. The problem occurs rarely. The problem has been resolved. Pertinent negatives include no chest pain, no abdominal pain and no headaches. Nothing aggravates the symptoms. Nothing relieves the symptoms. He has tried nothing for the symptoms. The treatment provided no relief.    Past Medical History:  Diagnosis Date  . Asperger syndrome   . Hyperlipidemia   . OCD (obsessive compulsive disorder)     Patient Active Problem List   Diagnosis Date Noted  . Social phobia 08/04/2016    No past surgical history on file.      Home Medications    Prior to Admission medications   Medication Sig Start Date End Date Taking? Authorizing Provider  amoxicillin-clavulanate (AUGMENTIN) 875-125 MG tablet One p o bid for ten d Patient not taking: Reported on 02/17/2018 01/22/18   Jeffrey Kirschner, MD  neomycin-polymyxin-hydrocortisone (CORTISPORIN) OTIC solution Place 4 drops into the right ear 4 (four) times daily. Patient not taking: Reported on 02/17/2018 01/22/18   Jeffrey Kirschner, MD    Family History Family History  Problem Relation Age of Onset  . Heart disease Other     Social History Social History   Tobacco Use  . Smoking status: Never Smoker  . Smokeless tobacco: Never Used  Substance Use Topics  . Alcohol use: Not Currently    Comment: occasional  . Drug use: No     Allergies   Celexa [citalopram hydrobromide] and Keflex  [cephalexin]   Review of Systems Review of Systems  Constitutional: Negative for appetite change and fatigue.  HENT: Negative for congestion, ear discharge and sinus pressure.   Eyes: Negative for discharge.  Respiratory: Negative for cough.   Cardiovascular: Negative for chest pain.  Gastrointestinal: Negative for abdominal pain and diarrhea.  Genitourinary: Negative for frequency and hematuria.  Musculoskeletal: Negative for back pain.       Left shoulder pain  Skin: Negative for rash.  Neurological: Negative for seizures and headaches.  Psychiatric/Behavioral: Negative for hallucinations.     Physical Exam Updated Vital Signs BP 119/76   Pulse 94   Temp 98 F (36.7 C) (Oral)   Resp 17   Ht 5\' 8"  (1.727 m)   Wt 95.3 kg   SpO2 100%   BMI 31.93 kg/m   Physical Exam Vitals signs and nursing note reviewed.  Constitutional:      Appearance: He is well-developed.  HENT:     Head: Normocephalic.     Comments: Abrasion to back of head    Nose: Nose normal.  Eyes:     General: No scleral icterus.    Conjunctiva/sclera: Conjunctivae normal.  Neck:     Musculoskeletal: Neck supple.     Thyroid: No thyromegaly.  Cardiovascular:     Rate and Rhythm: Normal rate and regular rhythm.     Heart sounds: No murmur. No friction rub. No  gallop.   Pulmonary:     Breath sounds: No stridor. No wheezing or rales.  Chest:     Chest wall: No tenderness.  Abdominal:     General: There is no distension.     Tenderness: There is no abdominal tenderness. There is no rebound.  Musculoskeletal: Normal range of motion.     Comments: Deformity to left shoulder  Lymphadenopathy:     Cervical: No cervical adenopathy.  Skin:    Findings: No erythema or rash.  Neurological:     Mental Status: He is oriented to person, place, and time.     Motor: No abnormal muscle tone.     Coordination: Coordination normal.  Psychiatric:        Behavior: Behavior normal.      ED Treatments /  Results  Labs (all labs ordered are listed, but only abnormal results are displayed) Labs Reviewed  CBC WITH DIFFERENTIAL/PLATELET - Abnormal; Notable for the following components:      Result Value   WBC 11.3 (*)    Neutro Abs 9.5 (*)    All other components within normal limits  COMPREHENSIVE METABOLIC PANEL - Abnormal; Notable for the following components:   Glucose, Bld 140 (*)    Calcium 8.4 (*)    All other components within normal limits  CBG MONITORING, ED - Abnormal; Notable for the following components:   Glucose-Capillary 56 (*)    All other components within normal limits  CBG MONITORING, ED - Abnormal; Notable for the following components:   Glucose-Capillary 131 (*)    All other components within normal limits  TROPONIN I    EKG None  Radiology Ct Head Wo Contrast  Result Date: 02/17/2018 CLINICAL DATA:  Found down in yard.  LEFT arm pain. EXAM: CT HEAD WITHOUT CONTRAST CT CERVICAL SPINE WITHOUT CONTRAST TECHNIQUE: Multidetector CT imaging of the head and cervical spine was performed following the standard protocol without intravenous contrast. Multiplanar CT image reconstructions of the cervical spine were also generated. COMPARISON:  CT HEAD and cervical spine December 29, 2007 FINDINGS: CT HEAD FINDINGS BRAIN: No intraparenchymal hemorrhage, mass effect nor midline shift. The ventricles and sulci are normal. No acute large vascular territory infarcts. No abnormal extra-axial fluid collections. Basal cisterns are patent. VASCULAR: Unremarkable. SKULL/SOFT TISSUES: No skull fracture. No significant soft tissue swelling. ORBITS/SINUSES: The included ocular globes and orbital contents are normal.Lobulated paranasal sinus mucosal thickening without air-fluid levels. Trace RIGHT mastoid effusion. Mild RIGHT external auditory canal soft tissue thickening. OTHER: None. CT CERVICAL SPINE FINDINGS ALIGNMENT: Maintained lordosis. Vertebral bodies in alignment. SKULL BASE AND  VERTEBRAE: Cervical vertebral bodies and posterior elements are intact. Elongated RIGHT C7 transverse process. Intervertebral disc heights preserved. Minimal C6-7 ventral endplate spurring. No destructive bony lesions. C1-2 articulation maintained. SOFT TISSUES AND SPINAL CANAL: Nonacute. DISC LEVELS: Uncovertebral hypertrophy resulting in moderate RIGHT C3-4 and C4-5 neural foraminal narrowing. UPPER CHEST: Lung apices are clear. OTHER: None. IMPRESSION: CT HEAD: 1. Normal noncontrast CT HEAD. 2. RIGHT external auditory canal soft tissues seen with cerumen or otitis externa. Recommend direct inspection. CT CERVICAL SPINE: 1. No fracture or malalignment. 2. Moderate RIGHT C3-4 and C4-5 neural foraminal narrowing. Electronically Signed   By: Elon Alas M.D.   On: 02/17/2018 18:55   Ct Cervical Spine Wo Contrast  Result Date: 02/17/2018 CLINICAL DATA:  Found down in yard.  LEFT arm pain. EXAM: CT HEAD WITHOUT CONTRAST CT CERVICAL SPINE WITHOUT CONTRAST TECHNIQUE: Multidetector CT imaging of the head and  cervical spine was performed following the standard protocol without intravenous contrast. Multiplanar CT image reconstructions of the cervical spine were also generated. COMPARISON:  CT HEAD and cervical spine December 29, 2007 FINDINGS: CT HEAD FINDINGS BRAIN: No intraparenchymal hemorrhage, mass effect nor midline shift. The ventricles and sulci are normal. No acute large vascular territory infarcts. No abnormal extra-axial fluid collections. Basal cisterns are patent. VASCULAR: Unremarkable. SKULL/SOFT TISSUES: No skull fracture. No significant soft tissue swelling. ORBITS/SINUSES: The included ocular globes and orbital contents are normal.Lobulated paranasal sinus mucosal thickening without air-fluid levels. Trace RIGHT mastoid effusion. Mild RIGHT external auditory canal soft tissue thickening. OTHER: None. CT CERVICAL SPINE FINDINGS ALIGNMENT: Maintained lordosis. Vertebral bodies in alignment. SKULL  BASE AND VERTEBRAE: Cervical vertebral bodies and posterior elements are intact. Elongated RIGHT C7 transverse process. Intervertebral disc heights preserved. Minimal C6-7 ventral endplate spurring. No destructive bony lesions. C1-2 articulation maintained. SOFT TISSUES AND SPINAL CANAL: Nonacute. DISC LEVELS: Uncovertebral hypertrophy resulting in moderate RIGHT C3-4 and C4-5 neural foraminal narrowing. UPPER CHEST: Lung apices are clear. OTHER: None. IMPRESSION: CT HEAD: 1. Normal noncontrast CT HEAD. 2. RIGHT external auditory canal soft tissues seen with cerumen or otitis externa. Recommend direct inspection. CT CERVICAL SPINE: 1. No fracture or malalignment. 2. Moderate RIGHT C3-4 and C4-5 neural foraminal narrowing. Electronically Signed   By: Elon Alas M.D.   On: 02/17/2018 18:55   Dg Shoulder Left  Result Date: 02/17/2018 CLINICAL DATA:  Recent fall with left shoulder pain, initial encounter EXAM: LEFT SHOULDER - 2+ VIEW COMPARISON:  None. FINDINGS: Anterior inferior dislocation of the humeral head is noted with respect to the glenoid. The underlying bony thorax and remainder of the shoulder is within normal limits. IMPRESSION: Anterior inferior dislocation of the left humeral head. Electronically Signed   By: Jeffrey Catalina M.D.   On: 02/17/2018 16:35   Dg Shoulder Left Portable  Result Date: 02/17/2018 CLINICAL DATA:  Post reduction EXAM: LEFT SHOULDER - 1 VIEW COMPARISON:  02/17/2017 FINDINGS: There is improved alignment following reduction. No residual subluxation or dislocation. No acute osseous finding. IMPRESSION: Normal alignment following reduction. Electronically Signed   By: Jerilynn Mages.  Shick M.D.   On: 02/17/2018 20:08    Procedures .Sedation Date/Time: 02/17/2018 8:45 PM Performed by: Milton Ferguson, MD Authorized by: Milton Ferguson, MD   Consent:    Consent obtained:  Verbal   Consent given by:  Patient   Risks discussed:  Allergic reaction, dysrhythmia, inadequate sedation,  nausea, prolonged hypoxia resulting in organ damage, prolonged sedation necessitating reversal, respiratory compromise necessitating ventilatory assistance and intubation and vomiting   Alternatives discussed:  Analgesia without sedation, anxiolysis and regional anesthesia Universal protocol:    Procedure explained and questions answered to patient or proxy's satisfaction: yes     Relevant documents present and verified: yes     Test results available and properly labeled: yes     Imaging studies available: yes     Required blood products, implants, devices, and special equipment available: yes     Site/side marked: yes     Immediately prior to procedure a time out was called: yes     Patient identity confirmation method:  Verbally with patient Indications:    Procedure necessitating sedation performed by:  Physician performing sedation Pre-sedation assessment:    Time since last food or drink:  Breakfast   ASA classification: class 1 - normal, healthy patient     Neck mobility: normal     Mouth opening:  3 or more  finger widths   Thyromental distance:  4 finger widths   Mallampati score:  I - soft palate, uvula, fauces, pillars visible   Pre-sedation assessments completed and reviewed: airway patency, cardiovascular function, hydration status, mental status, nausea/vomiting, pain level, respiratory function and temperature   Immediate pre-procedure details:    Reassessment: Patient reassessed immediately prior to procedure     Reviewed: vital signs, relevant labs/tests and NPO status     Verified: bag valve mask available, emergency equipment available, intubation equipment available, IV patency confirmed, oxygen available and suction available   Procedure details (see MAR for exact dosages):    Preoxygenation:  Nasal cannula   Sedation:  Propofol   Intra-procedure monitoring:  Blood pressure monitoring, cardiac monitor, continuous pulse oximetry, frequent LOC assessments, frequent vital  sign checks and continuous capnometry   Intra-procedure events: none     Total Provider sedation time (minutes):  15 Post-procedure details:    Attendance: Constant attendance by certified staff until patient recovered     Recovery: Patient returned to pre-procedure baseline     Post-sedation assessments completed and reviewed: airway patency, cardiovascular function, hydration status, mental status, nausea/vomiting, pain level, respiratory function and temperature     Patient is stable for discharge or admission: yes     Patient tolerance:  Tolerated well, no immediate complications Comments:     Patient given 40 mg of etomidate and tolerated the procedure well  .Ortho Injury Treatment Date/Time: 02/17/2018 8:47 PM Performed by: Milton Ferguson, MD Authorized by: Milton Ferguson, MD   Consent:    Consent obtained:  Verbal   Consent given by:  Patient   Risks discussed:  Irreducible dislocation   Alternatives discussed:  No treatmentInjury location: shoulder Pre-procedure distal perfusion: normal Pre-procedure neurological function: normal Pre-procedure range of motion: normal  Anesthesia: Local anesthesia used: no  Patient sedated: Yes. Refer to sedation procedure documentation for details of sedation. Immobilization: sling Post-procedure distal perfusion: normal Post-procedure neurological function: normal Post-procedure range of motion: normal Comments: Patient had a dislocated left shoulder that was reduced without problems     (including critical care time)  Medications Ordered in ED Medications  morphine 4 MG/ML injection 4 mg (4 mg Intravenous Given 02/17/18 1703)  morphine 4 MG/ML injection 4 mg (4 mg Intravenous Given 02/17/18 1901)  etomidate (AMIDATE) injection 14 mg (14 mg Intravenous Given 02/17/18 1933)     Initial Impression / Assessment and Plan / ED Course  I have reviewed the triage vital signs and the nursing notes.  Pertinent labs & imaging results  that were available during my care of the patient were reviewed by me and considered in my medical decision making (see chart for details).     Labs unremarkable CT scan of the head negative.  Left shoulder dislocated.  Patient had shoulder reduced and is instructed not to drive until his family doctor says that is okay.  Patient did initially have a low glucose and it may be related to not eating lunch.  He is also following up with orthopedics  Final Clinical Impressions(s) / ED Diagnoses   Final diagnoses:  Fall, initial encounter  Syncope, unspecified syncope type    ED Discharge Orders    None       Milton Ferguson, MD 02/17/18 2046    Milton Ferguson, MD 02/17/18 2048

## 2018-02-17 NOTE — Discharge Instructions (Addendum)
Drink plenty of fluids.  Make sure you eat a meal 3 times a day.  Follow-up with your family doctor next week for recheck.  Do not drive until your doctor states is okay.  Follow-up with Dr. Aline Brochure for your shoulder.  Take Tylenol or Motrin for pain

## 2018-02-17 NOTE — ED Triage Notes (Addendum)
Neighbors called EMS after hearing patient call for help, patient was seen by neighbors laying flat on face with both arms stretched out.  Patient states he felt like he lightheaded while working in yard.   EMS reports a "knot" on left shoulder. Patient describes pain and difficult to move left arm.  Patient states he has had one "stress seizure in 2003" but nothing had been done about it.  Patient is not on any medications.   Patient does have an abrasion to the top of his head, but has no memory of what happened.

## 2018-02-18 ENCOUNTER — Ambulatory Visit (INDEPENDENT_AMBULATORY_CARE_PROVIDER_SITE_OTHER): Payer: PPO | Admitting: Otolaryngology

## 2018-02-18 ENCOUNTER — Encounter: Payer: Self-pay | Admitting: Family Medicine

## 2018-02-18 ENCOUNTER — Ambulatory Visit (INDEPENDENT_AMBULATORY_CARE_PROVIDER_SITE_OTHER): Payer: Medicare HMO | Admitting: Family Medicine

## 2018-02-18 VITALS — BP 130/80 | Temp 98.6°F | Ht 69.5 in | Wt 216.0 lb

## 2018-02-18 DIAGNOSIS — H6121 Impacted cerumen, right ear: Secondary | ICD-10-CM

## 2018-02-18 DIAGNOSIS — S43005A Unspecified dislocation of left shoulder joint, initial encounter: Secondary | ICD-10-CM

## 2018-02-18 DIAGNOSIS — M25512 Pain in left shoulder: Secondary | ICD-10-CM

## 2018-02-18 DIAGNOSIS — H608X1 Other otitis externa, right ear: Secondary | ICD-10-CM

## 2018-02-18 DIAGNOSIS — R55 Syncope and collapse: Secondary | ICD-10-CM | POA: Diagnosis not present

## 2018-02-18 DIAGNOSIS — Z13220 Encounter for screening for lipoid disorders: Secondary | ICD-10-CM

## 2018-02-18 DIAGNOSIS — W1839XD Other fall on same level, subsequent encounter: Secondary | ICD-10-CM | POA: Diagnosis not present

## 2018-02-18 DIAGNOSIS — S43005D Unspecified dislocation of left shoulder joint, subsequent encounter: Secondary | ICD-10-CM

## 2018-02-18 DIAGNOSIS — Z79899 Other long term (current) drug therapy: Secondary | ICD-10-CM

## 2018-02-18 MED ORDER — NEOMYCIN-POLYMYXIN-HC 3.5-10000-1 OT SOLN: 4.0000 [drp] | Freq: Four times a day (QID) | OTIC | 0 refills | Status: DC

## 2018-02-18 NOTE — Progress Notes (Signed)
Subjective:    Patient ID: Jeffrey Compton, male    DOB: [birth year], 38 y.o.   MRN: [id number]  HPI  Patient is here today for a follow up on his ed visit yesterday.  Ed note states patient showed up at ed yesterday after not eating much and then in the afternoon felt a stinging in his head and then proceeded to pass out.    He states he was doing outside work when he started feeling lightheaded then the next thing he knew he was on the ground and other people were helping him and EMS had come to bring him to the hospital.  His dad states that he spoke with him at the site of the accident and Jeffrey Compton was perfectly with it and had not vomited on himself or urinated or defecated.  There was no sign of any type of oral trauma nothing to point toward seizure  Patient states he was working in his yard felt weak and passed out. He states the neighbor saw him laying there and called ems.Ems then took him to [facility].  Patient states the ed told him his blood sugar was low at 56. Patient states he has never been told he was diathetic or hypoglycemic.  He states he dislocated his left arm during the fall.  Review of Systems  Constitutional: Negative for diaphoresis and fatigue.  HENT: Negative for congestion and rhinorrhea.   Respiratory: Negative for cough and shortness of breath.   Cardiovascular: Negative for chest pain and leg swelling.  Gastrointestinal: Negative for abdominal pain and diarrhea.  Skin: Negative for color change and rash.  Neurological: Positive for dizziness, syncope and light-headedness. Negative for headaches.  Psychiatric/Behavioral: Negative for behavioral problems and confusion.       Objective:   Physical Exam Vitals signs reviewed.  Constitutional:      General: He is not in acute distress. HENT:     Head: Normocephalic and atraumatic.  Eyes:     General:        Right eye: No discharge.        Left eye: No discharge.  Neck:     Trachea: No  tracheal deviation.  Cardiovascular:     Rate and Rhythm: Normal rate and regular rhythm.     Heart sounds: Normal heart sounds. No murmur.  Pulmonary:     Effort: Pulmonary effort is normal. No respiratory distress.     Breath sounds: Normal breath sounds.  Lymphadenopathy:     Cervical: No cervical adenopathy.  Skin:    General: Skin is warm and dry.  Neurological:     Mental Status: He is alert.     Coordination: Coordination normal.  Psychiatric:        Behavior: Behavior normal.     Neurologic exam normal      Assessment & Plan:  Dislocated shoulder now is put back into place-avoid excessive movement of the shoulder see orthopedics  Syncope-more likely related to the fact he had an ate that day and was doing a lot of activity but need to rule out arrhythmia EKG showed a PVC   I doubt a seizure caused his syncope.  I would recommend consultation with cardiology for further evaluation to make sure there is not an underlying arrhythmia as a source of passing out  It could certainly be all related to the fact that he just did not eat much before that episode  I would recommend no driving for the next 2  to 3 days then if feeling well he can drive as long as he can safely operate a vehicle primarily using his right arm

## 2018-02-22 ENCOUNTER — Encounter: Payer: Self-pay | Admitting: Family Medicine

## 2018-02-22 DIAGNOSIS — Z79899 Other long term (current) drug therapy: Secondary | ICD-10-CM | POA: Diagnosis not present

## 2018-02-22 DIAGNOSIS — Z13220 Encounter for screening for lipoid disorders: Secondary | ICD-10-CM | POA: Diagnosis not present

## 2018-02-23 DIAGNOSIS — S43005A Unspecified dislocation of left shoulder joint, initial encounter: Secondary | ICD-10-CM | POA: Diagnosis not present

## 2018-02-23 LAB — LIPID PANEL
Chol/HDL Ratio: 8.1 ratio — ABNORMAL HIGH (ref 0.0–5.0)
Cholesterol, Total: 218 mg/dL — ABNORMAL HIGH (ref 100–199)
HDL: 27 mg/dL — ABNORMAL LOW (ref >39)
LDL Calculated: 140 mg/dL — ABNORMAL HIGH (ref 0–99)
Triglycerides: 256 mg/dL — ABNORMAL HIGH (ref 0–149)
VLDL Cholesterol Cal: 51 mg/dL — ABNORMAL HIGH (ref 5–40)

## 2018-02-23 LAB — BASIC METABOLIC PANEL
BUN/Creatinine Ratio: 10 (ref 9–20)
BUN: 11 mg/dL (ref 6–20)
CHLORIDE: 99 mmol/L (ref 96–106)
CO2: 25 mmol/L (ref 20–29)
Calcium: 9.4 mg/dL (ref 8.7–10.2)
Creatinine, Ser: 1.06 mg/dL (ref 0.76–1.27)
GFR calc Af Amer: 105 mL/min/{1.73_m2} (ref >59)
GFR calc non Af Amer: 90 mL/min/{1.73_m2} (ref >59)
Glucose: 96 mg/dL (ref 65–99)
POTASSIUM: 5.2 mmol/L (ref 3.5–5.2)
Sodium: 139 mmol/L (ref 134–144)

## 2018-02-24 ENCOUNTER — Encounter: Payer: Self-pay | Admitting: Family Medicine

## 2018-02-25 ENCOUNTER — Telehealth: Payer: Self-pay | Admitting: Family Medicine

## 2018-02-25 NOTE — Telephone Encounter (Signed)
Please advise 

## 2018-02-25 NOTE — Telephone Encounter (Signed)
Unfortunately I am not aware of any other medications that would necessarily help They should consider seeing what the price of this medicine would be out-of-pocket without insurance They also auto check a more reasonable pharmacy such as [facility] if they need the prescription printed so they can shop it around they can There is also online good Rx that could give him a better price  Finally they can call their insurance company to see if they have any options

## 2018-02-25 NOTE — Telephone Encounter (Signed)
Pt went to pick up medication of neomycin-polymyxin-hydrocortisone (CORTISPORIN) OTIC solution from pharmacy and patient states medication was going to cost $100 through insurance, pt would like to know if there is any other medication that he could use that possibly would cost less. Advise.   Pharmacy:  [facility] [place], [address] [place]

## 2018-02-25 NOTE — Telephone Encounter (Signed)
Left message to return call 

## 2018-02-26 NOTE — Telephone Encounter (Signed)
Patient advised Unfortunately Dr Nicki Reaper is not aware of any other medications that would necessarily help They should consider seeing what the price of this medicine would be out-of-pocket without insurance They also auto check a more reasonable pharmacy such as [facility] if they need the prescription printed so they can shop it around they can There is also online good Rx that could give him a better price Finally they can call their insurance company to see if they have any options

## 2018-03-04 NOTE — Progress Notes (Signed)
Cardiology Office Note   Date:  03/08/2018   ID:  Jeffrey, Compton [birth year], MRN [id number]  PCP:  Kathyrn Drown, MD  Cardiologist:   Jenkins Rouge, MD   No chief complaint on file.     History of Present Illness: Jeffrey Compton is a 38 y.o. male who presents for consultation regarding syncope:  Referred by Dr Wolfgang Phoenix. Seen in ER 02/17/18 Did not eart Most of day and after lunch felt stinging in his head and "passed out"  Dislocated left shoulder which was reduced and placed in sling  No chest pain palpitations or dyspnea Patient has Asperger with OCD  CT head negative BS was initially low No cardiac history Note made of an isolated PVC on telemetry   He has felt fine since ER visit. 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 Asperger syndrome   . Hyperlipidemia   . OCD (obsessive compulsive disorder)     No past surgical history on file.   No current outpatient medications on file.   No current facility-administered medications for this visit.     Allergies:   Celexa [citalopram hydrobromide] and Keflex [cephalexin]    Social History:  The patient  reports that he has never smoked. He has never used smokeless tobacco. He reports previous alcohol use. He reports that he does not use drugs.   Family History:  The patient's family history includes Heart disease in an other family member.    ROS:  Please see the history of present illness.   Otherwise, review of systems are positive for none.   All other systems are reviewed and negative.    PHYSICAL EXAM: VS:  BP 124/75 (BP Location: Right Arm)   Pulse 82   Ht 5\' 10"  (1.778 m)   Wt 98.4 kg   SpO2 99%   BMI 31.14 kg/m  , BMI Body mass index is 31.14 kg/m. Affect appropriate Healthy:  appears stated age 60: normal Neck supple with no adenopathy JVP normal no  bruits no thyromegaly Lungs clear with no wheezing and good diaphragmatic motion Heart:  S1/S2 no murmur, no rub, gallop or click PMI normal Abdomen: benighn, BS positve, no tenderness, no AAA no bruit.  No HSM or HJR Distal pulses intact with no bruits No edema Neuro non-focal Skin warm and dry No muscular weakness    EKG:  ST rate 113 isolated PVC otherwise normal 02/18/18   Recent Labs: 02/17/2018: ALT 40; Hemoglobin 14.1; Platelets 253 02/22/2018: BUN 11; Creatinine, Ser 1.06; Potassium 5.2; Sodium 139    Lipid Panel    Component Value Date/Time   CHOL 218 (H) 02/22/2018 0817   TRIG 256 (H) 02/22/2018 0817   HDL 27 (L) 02/22/2018 0817   CHOLHDL 8.1 (H) 02/22/2018 0817   CHOLHDL 8.1 04/06/2012 1009   VLDL 73 (H) 04/06/2012 1009   LDLCALC 140 (H) 02/22/2018 0817      Wt Readings from Last 3 Encounters:  03/08/18 98.4 kg  02/18/18 98 kg  02/17/18 95.3 kg      Other studies Reviewed: Additional studies/ records that were reviewed today include: Notes from primary Notes from ER labs CXR, ECG .    ASSESSMENT AND PLAN:  1.  Syncope:  Does not appear to be high risk for cardiac event. Will get TTE to r/o structural heart  disease   2. Ortho:  Post left shoulder dislocation f/u PT/OT reduced in ER f/u Noemi Chapel PT/OT    Current medicines are reviewed at length with the patient today.  The patient does not have concerns regarding medicines.  The following changes have been made:  no change  Labs/ tests ordered today include: TTE    Orders Placed This Encounter  Procedures  . ECHOCARDIOGRAM COMPLETE     Disposition:   FU with cardiology PRN      Signed, Jenkins Rouge, MD  03/08/2018 9:44 AM    [facility] [address], [place], [place]  [address] Phone: [phone]; Fax: [phone]

## 2018-03-08 ENCOUNTER — Ambulatory Visit: Payer: Medicare HMO | Admitting: Cardiovascular Disease

## 2018-03-08 ENCOUNTER — Encounter: Payer: Self-pay | Admitting: Cardiovascular Disease

## 2018-03-08 VITALS — BP 124/75 | HR 82 | Ht 70.0 in | Wt 217.0 lb

## 2018-03-08 DIAGNOSIS — R55 Syncope and collapse: Secondary | ICD-10-CM

## 2018-03-08 NOTE — Patient Instructions (Signed)
Medication Instructions:  Your physician recommends that you continue on your current medications as directed. Please refer to the Current Medication list given to you today.  If you need a refill on your cardiac medications before your next appointment, please call your pharmacy.   Lab work: NONE   If you have labs (blood work) drawn today and your tests are completely normal, you will receive your results only by: Marland Kitchen MyChart Message (if you have MyChart) OR . A paper copy in the mail If you have any lab test that is abnormal or we need to change your treatment, we will call you to review the results.  Testing/Procedures: Your physician has requested that you have an echocardiogram. Echocardiography is a painless test that uses sound waves to create images of your heart. It provides your doctor with information about the size and shape of your heart and how well your heart's chambers and valves are working. This procedure takes approximately one hour. There are no restrictions for this procedure.    Follow-Up: At [facility], you and your health needs are our priority.  As part of our continuing mission to provide you with exceptional heart care, we have created designated Provider Care Teams.  These Care Teams include your primary Cardiologist (physician) and Advanced Practice Providers (APPs -  Physician Assistants and Nurse Practitioners) who all work together to provide you with the care you need, when you need it. You will need a follow up appointment as needed. Please call our office 2 months in advance to schedule this appointment.  You may see No primary care provider on file. or one of the following Advanced Practice Providers on your designated Care Team:   [place], PA-C [facility]) . Ermalinda Barrios, PA-C ([facility])  Any Other Special Instructions Will Be Listed Below (If Applicable). Thank you for choosing [facility]!

## 2018-03-09 DIAGNOSIS — S43005D Unspecified dislocation of left shoulder joint, subsequent encounter: Secondary | ICD-10-CM | POA: Diagnosis not present

## 2018-03-11 ENCOUNTER — Ambulatory Visit (HOSPITAL_COMMUNITY): Payer: Medicare HMO | Attending: Cardiovascular Disease

## 2018-03-12 ENCOUNTER — Encounter (HOSPITAL_COMMUNITY): Payer: Self-pay

## 2018-03-12 ENCOUNTER — Other Ambulatory Visit: Payer: Self-pay

## 2018-03-12 ENCOUNTER — Ambulatory Visit (HOSPITAL_COMMUNITY): Payer: Medicare HMO | Attending: Orthopedic Surgery

## 2018-03-12 DIAGNOSIS — R29898 Other symptoms and signs involving the musculoskeletal system: Secondary | ICD-10-CM | POA: Diagnosis not present

## 2018-03-12 DIAGNOSIS — M25512 Pain in left shoulder: Secondary | ICD-10-CM | POA: Diagnosis not present

## 2018-03-12 NOTE — Patient Instructions (Signed)
Self STM: Upper Trap  Stand at corner or doorframe and place ball on your upper trap area on the top of your shoulder.  Bend over at the waist so that the ball is held between the wall and top of shoulder.  Perform self massage to this area by rolling over tender spots or keeping the ball on the tender spot and then moving the head. Focus on any spot that feels tight or tender.  Spend 3-5 minutes on tender areas.    ELASTIC BAND SHOULDER EXTERNAL ROTATION - ER  While holding an elastic band at your side with your elbow bent, start with your hand near your stomach and then pull the band away. Keep your elbow at your side the entire time. 10-15 times   Horizontal Abduction. Shoulder  Hold resistive band keeping shoulders down. Slowly pull band laterally engaging scapular musculature until feeling the squeeze between shoulder blade. 10-15 times.   (Home) Extension: Isometric / Bilateral Arm Retraction - Sitting   Facing anchor, hold hands and elbow at shoulder height, with elbow bent.  Pull arms back to squeeze shoulder blades together. Repeat 10-15 times. 1-3 times/day.   (Clinic) Extension / Flexion (Assist)   Face anchor, pull arms back, keeping elbow straight, and squeze shoulder blades together. Repeat 10-15 times. 1-3 times/day.   Copyright  VHI. All rights reserved.   (Home) Retraction: Row - Bilateral (Anchor)   Facing anchor, arms reaching forward, pull hands toward stomach, keeping elbows bent and at your sides and pinching shoulder blades together. Repeat 10-15 times. 1-3 times/day.   Copyright  VHI. All rights reserved.

## 2018-03-14 NOTE — Therapy (Signed)
[place], [place], [address] Phone: [phone]   Fax:  [phone]  Occupational Therapy Evaluation  Patient Details  Name: Jeffrey Compton MRN: [id number] Date of Birth: 09-Jun-1980 Referring Provider (OT): Elsie Saas, MD   Encounter Date: 03/12/2018  OT End of Session - 03/14/18 1841    Visit Number  1    Number of Visits  4    Date for OT Re-Evaluation  04/09/18    Authorization Type  [employer] Medicare HMO     Authorization Time Period  $35 co pay no visit limit    OT Start Time  0945    OT Stop Time  1021    OT Time Calculation (min)  36 min    Activity Tolerance  Patient tolerated treatment well    Behavior During Therapy  [facility] for tasks assessed/performed       Past Medical History:  Diagnosis Date  . Asperger syndrome   . Hyperlipidemia   . OCD (obsessive compulsive disorder)     History reviewed. No pertinent surgical history.  There were no vitals filed for this visit.     [facility] OT Assessment - 03/14/18 1837      Assessment   Medical Diagnosis  left shoulder dislocation    Referring Provider (OT)  Elsie Saas, MD    Onset Date/Surgical Date  02/14/18    Hand Dominance  Left    Next MD Visit  03/30/18    Prior Therapy  None for this condition      Precautions   Precautions  Shoulder    Type of Shoulder Precautions  no IR/er abducted      Restrictions   Weight Bearing Restrictions  No      Balance Screen   Has the patient fallen in the past 6 months  Yes    How many times?  1    Has the patient had a decrease in activity level because of a fear of falling?   No    Is the patient reluctant to leave their home because of a fear of falling?   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       ADL   ADL comments  Experiencing muscle soreness in left shoulder since completing the exercises that the MD established ( green theraband 30X each exercise). Reports instability and slight decrease strength.       Mobility   Mobility Status  Independent      Written Expression   Dominant Hand  Left      Vision - History   Baseline Vision  Wears glasses all the time      Cognition   Overall Cognitive Status  Within Functional Limits for tasks assessed      Observation/Other Assessments   Focus on Therapeutic Outcomes (FOTO)   N/A      ROM / Strength   AROM / PROM / Strength  AROM;Strength      Palpation   Palpation comment  Min fascial restrictiona in left upper arm and AC joint.      AROM   Overall AROM   Within functional limits for tasks performed    AROM Assessment  Site  Shoulder    Right/Left Shoulder  Left      PROM   Overall PROM   Within functional limits for tasks performed    PROM Assessment Site  Shoulder      Strength   Overall Strength Comments  Assessed seated. IR/er adducted.     Strength Assessment Site  Shoulder    Right/Left Shoulder  Left    Left Shoulder Flexion  4-/5    Left Shoulder ABduction  4-/5    Left Shoulder Internal Rotation  5/5    Left Shoulder External Rotation  4/5                      OT Education - 03/14/18 1840    Education Details  shoulder strengthening and scapular stability with green theraband    Person(s) Educated  Patient    Methods  Explanation;Demonstration;Verbal cues;Tactile cues;Handout    Comprehension  Verbalized understanding;Returned demonstration       OT Short Term Goals - 03/14/18 1846      OT SHORT TERM GOAL #1   Title  Patient will be educated and independent with HEP in order to faciliate progress in therapy and return to using his left UE as his dominant with increased comfort.     Time  4    Period  Weeks    Status  New    Target Date  04/09/18      OT SHORT TERM  GOAL #2   Title  Patient will increase increased comfort in his LUE with a decreased pain score of 2/10 or less with use and during HEP.     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     Time  4    Period  Weeks    Status  New      OT SHORT TERM GOAL #4   Title  Patient will be educated on self myofascial and trigger point release in order to decrease fascial restrictions in his LUE to trace amount in order to decrease pain and discomfort during functional tasks.     Time  4    Period  Weeks    Status  New               Plan - 03/14/18 1842    Clinical Impression Statement  A: patient is a 38 y/o male S/P left shoulder dislocation causing increased pain and fascial restrictions and decreased strength resulting in difficulty and discomfort when completing daily tasks.     Occupational Profile and client history currently impacting functional performance  Patient is motivated to return to prior level of function.    Occupational performance deficits (Please refer to evaluation for details):  ADL's;IADL's;Leisure    Rehab Potential  Excellent    Current Impairments/barriers affecting progress:  None noted    OT Frequency  1x / week    OT Duration  4 weeks    OT Treatment/Interventions  Self-care/ADL training;Therapeutic exercise;DME and/or AE instruction;Manual Therapy;Neuromuscular education;Ultrasound;Electrical Stimulation;Moist Heat;Passive range of motion;Therapeutic activities;Patient/family education    Plan  P: Patient will benefit from skilled OT services to increase functional use of his LUE as his dominant extremity.  Treatment Plan: myofascial release, general shoulder and scapular strengthening. Update HEP as needed weekly.     Clinical Decision Making  Limited  treatment options, no task modification necessary    Consulted and Agree with Plan of Care  Patient       Patient will benefit from skilled therapeutic intervention in order to improve the following deficits and impairments:  Decreased strength, Increased fascial restrictions, Impaired UE functional use, Pain  Visit Diagnosis: Other symptoms and signs involving the musculoskeletal system - Plan: Ot plan of care cert/re-cert  Acute pain of left shoulder - Plan: Ot plan of care cert/re-cert    Problem List Patient Active Problem List   Diagnosis Date Noted  . Social phobia 08/04/2016   Ailene Ravel, OTR/L,CBIS  [phone]  03/14/2018, 6:52 PM  [facility] [address] [place], [place], [address] Phone: [phone]   Fax:  [phone]  Name: Jeffrey Compton MRN: [id number] Date of Birth: 11-Jul-1980

## 2018-03-17 ENCOUNTER — Ambulatory Visit (HOSPITAL_COMMUNITY): Payer: Medicare HMO

## 2018-03-17 ENCOUNTER — Encounter (HOSPITAL_COMMUNITY): Payer: Self-pay

## 2018-03-17 DIAGNOSIS — R29898 Other symptoms and signs involving the musculoskeletal system: Secondary | ICD-10-CM

## 2018-03-17 DIAGNOSIS — M25512 Pain in left shoulder: Secondary | ICD-10-CM

## 2018-03-17 NOTE — Therapy (Signed)
[place], [place], [address] Phone: [phone]   Fax:  [phone]  Occupational Therapy Treatment  Patient Details  Name: Jeffrey Compton MRN: [id number] Date of Birth: 1980-11-29 Referring Provider (OT): Elsie Saas, MD   Encounter Date: 03/17/2018  OT End of Session - 03/17/18 0939    Visit Number  2    Number of Visits  4    Date for OT Re-Evaluation  04/09/18    Authorization Type  [employer] Medicare HMO     Authorization Time Period  $35 co pay no visit limit    OT Start Time  0905    OT Stop Time  0945    OT Time Calculation (min)  40 min    Activity Tolerance  Patient tolerated treatment well    Behavior During Therapy  [facility] for tasks assessed/performed       Past Medical History:  Diagnosis Date  . Asperger syndrome   . Hyperlipidemia   . OCD (obsessive compulsive disorder)     History reviewed. No pertinent surgical history.  There were no vitals filed for this visit.  Subjective Assessment - 03/17/18 0918    Subjective   S: It's just a little sore.     Currently in Pain?  Yes    Pain Score  2     Pain Location  Shoulder    Pain Orientation  Left    Pain Descriptors / Indicators  Sore    Pain Type  Acute pain         [facility] OT Assessment - 03/17/18 0919      Assessment   Medical Diagnosis  left shoulder dislocation      Precautions   Precautions  Shoulder    Type of Shoulder Precautions  no IR/er abducted               OT Treatments/Exercises (OP) - 03/17/18 0919      Exercises   Exercises  Shoulder      Shoulder Exercises: Prone   Retraction  AROM;10 reps   with elbow straight   Horizontal ABduction 1  AROM;10 reps;Limitations    Horizontal ABduction 1 Limitations  therapist provided physical cues to depress scapula.    Other Prone Exercises  scaption; A/ROM; 10X;       Shoulder Exercises: Sidelying   External Rotation  Strengthening;10 reps    External  Rotation Weight (lbs)  4    Internal Rotation  Strengthening;10 reps    Internal Rotation Weight (lbs)  4    Flexion  Strengthening;10 reps    Flexion Weight (lbs)  4    ABduction  Strengthening;10 reps    ABduction Weight (lbs)  4    Other Sidelying Exercises  Protractions; 10X; 4#    Other Sidelying Exercises  Horizontal abduction; 4#, 10X      Shoulder Exercises: ROM/Strengthening   UBE (Upper Arm Bike)  Level 5 3' reverse only   pace: 14.0-15.0   Proximal Shoulder Strengthening, Seated  10X A/ROM no rest breaks      Manual Therapy   Manual Therapy  Soft tissue mobilization    Manual therapy comments  Manual therapy completed prior to exercises.     Soft tissue mobilization  Myofasical release completed to left AC joint and deltoid region to decrease fascial restrictions and allow for a pain free ROM.              OT Education -  03/17/18 2800    Education Details  middle trapezius/scapular strengthening prone. Retraction, scaption, abduction    Person(s) Educated  Patient    Methods  Explanation;Demonstration;Tactile cues;Verbal cues;Handout    Comprehension  Verbalized understanding;Returned demonstration       OT Short Term Goals - 03/17/18 0919      OT SHORT TERM GOAL #1   Title  Patient will be educated and independent with HEP in order to faciliate progress in therapy and return to using his left UE as his dominant with increased comfort.     Time  4    Period  Weeks    Status  On-going    Target Date  04/09/18      OT SHORT TERM GOAL #2   Title  Patient will increase increased comfort in his LUE with a decreased pain score of 2/10 or less with use and during HEP.     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     Time  4    Period  Weeks    Status   On-going      OT SHORT TERM GOAL #4   Title  Patient will be educated on self myofascial and trigger point release in order to decrease fascial restrictions in his LUE to trace amount in order to decrease pain and discomfort during functional tasks.     Time  4    Period  Weeks    Status  On-going               Plan - 03/17/18 1022    Clinical Impression Statement  A: Middle trapezius strength assessed this session with patient showing 3-/5 prone. HEP was updated to include middle trapezius strengthening prone.  VC for form and technique were completed as needed. Patient shows shoulder, scapular, and middle trapezius weakness while completing exercises during session. Pt reports that he is using a tennis ball for myofascial release and it is helping.     Plan  P: Follow up on HEP. Continue to focus on middle trapezius strengthening during prone strengthening. Add therapy ball strengthening.     Consulted and Agree with Plan of Care  Patient       Patient will benefit from skilled therapeutic intervention in order to improve the following deficits and impairments:  Decreased strength, Increased fascial restrictions, Impaired UE functional use, Pain  Visit Diagnosis: Other symptoms and signs involving the musculoskeletal system  Acute pain of left shoulder    Problem List Patient Active Problem List   Diagnosis Date Noted  . Social phobia 08/04/2016   Ailene Ravel, OTR/L,CBIS  [phone]  03/17/2018, 11:09 AM  [facility] [address] [place], [place], [address] Phone: [phone]   Fax:  [phone]  Name: Jeffrey Compton MRN: [id number] Date of Birth: March 26, 1980

## 2018-03-17 NOTE — Patient Instructions (Signed)
10 repetitions. 1 set. 1-2 times a day.   Prone Scapular Retraction  With arm hanging off table, pull shoulder blade in and up    Prone Shoulder Scaption  Prone Scaption      Prone Abduction  Lying on your stomach raise your arms up towards the ceiling, squeezing your shoulder blades together and maintain straight elbows.

## 2018-03-24 ENCOUNTER — Encounter (HOSPITAL_COMMUNITY): Payer: Self-pay

## 2018-03-24 ENCOUNTER — Ambulatory Visit (HOSPITAL_COMMUNITY): Payer: Medicare HMO | Attending: Orthopedic Surgery

## 2018-03-24 DIAGNOSIS — R29898 Other symptoms and signs involving the musculoskeletal system: Secondary | ICD-10-CM | POA: Diagnosis not present

## 2018-03-24 DIAGNOSIS — M25512 Pain in left shoulder: Secondary | ICD-10-CM | POA: Insufficient documentation

## 2018-03-24 NOTE — Therapy (Addendum)
[place], [place], [address] Phone: [phone]   Fax:  [phone]  Occupational Therapy Treatment  Patient Details  Name: Jeffrey Compton MRN: [id number] Date of Birth: 08/04/1980 Referring Provider (OT): Elsie Saas, MD   Encounter Date: 03/24/2018  OT End of Session - 03/24/18 0943    Visit Number  3    Number of Visits  4    Date for OT Re-Evaluation  04/09/18    Authorization Type  [employer] HMO     Authorization Time Period  $35 co pay no visit limit    OT Start Time  0905    OT Stop Time  0943    OT Time Calculation (min)  38 min    Activity Tolerance  Patient tolerated treatment well    Behavior During Therapy  [facility] for tasks assessed/performed       Past Medical History:  Diagnosis Date  . Asperger syndrome   . Hyperlipidemia   . OCD (obsessive compulsive disorder)     History reviewed. No pertinent surgical history.  There were no vitals filed for this visit.  Subjective Assessment - 03/24/18 0926    Subjective   S: I can tell a big difference this week compared to last week.     Currently in Pain?  Yes    Pain Score  1     Pain Location  Shoulder    Pain Orientation  Left    Pain Descriptors / Indicators  Sore    Pain Type  Acute pain    Pain Radiating Towards  N/A    Pain Frequency  Intermittent    Aggravating Factors   use and HEP    Pain Relieving Factors  Advil    Effect of Pain on Daily Activities  None    Multiple Pain Sites  No         [facility] OT Assessment - 03/24/18 0928      Assessment   Medical Diagnosis  left shoulder dislocation      Precautions   Precautions  Shoulder    Type of Shoulder Precautions  no IR/er abducted               OT Treatments/Exercises (OP) - 03/24/18 0928      Exercises   Exercises  Shoulder      Shoulder Exercises: Prone   Retraction  AROM;12 reps   straight elbow   Horizontal ABduction 1  AROM;12 reps    Other  Prone Exercises  scaption; A/ROM; 12X;     Other Prone Exercises  Hughston exercise; H1; 10X      Shoulder Exercises: Sidelying   External Rotation  Strengthening;12 reps    External Rotation Weight (lbs)  4    Internal Rotation  Strengthening;12 reps    Internal Rotation Weight (lbs)  4    Flexion  Strengthening;12 reps    Flexion Weight (lbs)  4    ABduction  Strengthening;12 reps    ABduction Weight (lbs)  4    Other Sidelying Exercises  Protractions; 12X; 4#    Other Sidelying Exercises  Horizontal abduction; 4#, 12X      Shoulder Exercises: Therapy Ball   Other Therapy Ball Exercises  green therapy ball: circles (left/right), chest press, flexion 12X each      Shoulder Exercises: ROM/Strengthening   UBE (Upper Arm Bike)  Level 5 3' reverse only   pace: 14.0-15.0   Other ROM/Strengthening Exercises  Red theraband loop; wall sides (6X ascending), Wall clock (10X each)      Manual Therapy   Manual Therapy  Soft tissue mobilization    Manual therapy comments  Manual therapy completed prior to exercises.     Soft tissue mobilization  Myofasical release completed to left AC joint and deltoid region to decrease fascial restrictions and allow for a pain free ROM.              OT Education - 03/24/18 [phone]    Education Details  red theraband loop: wall walks and wall slides    Person(s) Educated  Patient    Methods  Explanation;Demonstration;Handout;Verbal cues;Tactile cues    Comprehension  Returned demonstration;Verbalized understanding       OT Short Term Goals - 03/17/18 0919      OT SHORT TERM GOAL #1   Title  Patient will be educated and independent with HEP in order to faciliate progress in therapy and return to using his left UE as his dominant with increased comfort.     Time  4    Period  Weeks    Status  On-going    Target Date  04/09/18      OT SHORT TERM GOAL #2   Title  Patient will increase increased comfort in his LUE with a decreased pain score of  2/10 or less with use and during HEP.     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     Time  4    Period  Weeks    Status  On-going      OT SHORT TERM GOAL #4   Title  Patient will be educated on self myofascial and trigger point release in order to decrease fascial restrictions in his LUE to trace amount in order to decrease pain and discomfort during functional tasks.     Time  4    Period  Weeks    Status  On-going               Plan - 03/24/18 0944    Clinical Impression Statement  A: Pt was able to complete middle trapezius strengthening with better form this session and achieved more ROM although still presenting in weakness. Updated HEP to include additional shoulder stability exercises. VC for form and technique were needed during prone exercises.     Body Structure / Function / Physical Skills  ADL;Strength;Pain;UE functional use;Fascial restriction    Plan  P: Continue to follow up on HEP and update as needed. Continue focusing on middle trapezius strengthening in order to increase shoulder strength and stability.     Consulted and Agree with Plan of Care  Patient       Patient will benefit from skilled therapeutic intervention in order to improve the following deficits and impairments:  Body Structure / Function / Physical Skills  Visit Diagnosis: Other symptoms and signs involving the musculoskeletal system  Acute pain of left shoulder    Problem List Patient Active Problem List   Diagnosis Date Noted  . Social phobia 08/04/2016    Ailene Ravel, OTR/L,CBIS  [phone]  03/24/2018, 9:47 AM  [place] [address]  [place], [place], [address] Phone: [phone]   Fax:  [phone]  Name:  Jeffrey Compton MRN: [id number] Date of Birth: 05/10/1980

## 2018-03-24 NOTE — Patient Instructions (Signed)
SERRATUS WALL SLIDE - ELASTIC BAND  Place an elastic band around your arms at the level of your wrists as shown. Next, place your forearms and hands along a wall so that your elbows are bent and your arms point towards the ceiling.   Then, walk you hands up the wall while keeping constant tension on band.    Complete 6-10 time going up.      scap clocks  Tie a loop with a theraband and place around your wrists.  Stand with a wall in front of you.  Picture a clock in front of you.  Place both palms on the wall, arms straight.  While keeping the right hand planted, use the left hand to pull away and tap each number (1, 3, 5), coming back to center each time.  Do each number 10X times.

## 2018-03-25 DIAGNOSIS — R69 Illness, unspecified: Secondary | ICD-10-CM | POA: Diagnosis not present

## 2018-03-25 DIAGNOSIS — H6123 Impacted cerumen, bilateral: Secondary | ICD-10-CM | POA: Diagnosis not present

## 2018-03-25 DIAGNOSIS — H60333 Swimmer's ear, bilateral: Secondary | ICD-10-CM | POA: Diagnosis not present

## 2018-03-30 DIAGNOSIS — H60333 Swimmer's ear, bilateral: Secondary | ICD-10-CM | POA: Diagnosis not present

## 2018-03-31 ENCOUNTER — Other Ambulatory Visit: Payer: Self-pay

## 2018-03-31 ENCOUNTER — Encounter (HOSPITAL_COMMUNITY): Payer: Self-pay

## 2018-03-31 ENCOUNTER — Ambulatory Visit (HOSPITAL_COMMUNITY): Payer: Medicare HMO

## 2018-03-31 DIAGNOSIS — R29898 Other symptoms and signs involving the musculoskeletal system: Secondary | ICD-10-CM

## 2018-03-31 DIAGNOSIS — M25512 Pain in left shoulder: Secondary | ICD-10-CM | POA: Diagnosis not present

## 2018-03-31 NOTE — Therapy (Signed)
[place], [place], [address] Phone: [phone]   Fax:  [phone]  Occupational Therapy Treatment  Patient Details  Name: Jeffrey Compton MRN: [id number] Date of Birth: 1980/08/23 Referring Provider (OT): Elsie Saas, MD   Encounter Date: 03/31/2018  OT End of Session - 03/31/18 1100    Visit Number  4    Number of Visits  5    Date for OT Re-Evaluation  04/09/18    Authorization Type  [employer] Medicare HMO     Authorization Time Period  $35 co pay no visit limit    OT Start Time  0904    OT Stop Time  0937    OT Time Calculation (min)  33 min    Activity Tolerance  Patient tolerated treatment well    Behavior During Therapy  [facility] for tasks assessed/performed       Past Medical History:  Diagnosis Date  . Asperger syndrome   . Hyperlipidemia   . OCD (obsessive compulsive disorder)     History reviewed. No pertinent surgical history.  There were no vitals filed for this visit.  Subjective Assessment - 03/31/18 0910    Subjective   S: It's feeling a lot better.     Currently in Pain?  Yes    Pain Score  1     Pain Location  Shoulder    Pain Orientation  Left    Pain Descriptors / Indicators  Sore    Pain Type  Acute pain    Pain Radiating Towards  N/A    Pain Onset  Yesterday    Pain Frequency  Intermittent    Aggravating Factors   use and HEP    Pain Relieving Factors  Rest and task modification    Effect of Pain on Daily Activities  None         [facility] OT Assessment - 03/31/18 0912      Assessment   Medical Diagnosis  left shoulder dislocation      Precautions   Precautions  Shoulder    Type of Shoulder Precautions  no IR/er abducted               OT Treatments/Exercises (OP) - 03/31/18 0912      Exercises   Exercises  Shoulder      Shoulder Exercises: Prone   Other Prone Exercises  scaption; A/ROM; 12X;     Other Prone Exercises  Hughston exercise; H1, H3 10X       Shoulder Exercises: Sidelying   External Rotation  Strengthening;15 reps    External Rotation Weight (lbs)  4    Internal Rotation  Strengthening;15 reps    Internal Rotation Weight (lbs)  4    Flexion  Strengthening;12 reps    Flexion Weight (lbs)  4    ABduction  Strengthening;15 reps    ABduction Weight (lbs)  4    Other Sidelying Exercises  Protraction; 15X, 4#    Other Sidelying Exercises  Horizontal abduction; 15X, 4#      Shoulder Exercises: Therapy Ball   Other Therapy Ball Exercises  green therapy ball: circles (left/right), chest press, flexion 12X each      Shoulder Exercises: ROM/Strengthening   UBE (Upper Arm Bike)  Level 6 reverse only 3'   pace: 14.0-15.0   Other ROM/Strengthening Exercises  Red theraband loop; wall sides (8X ascending), Wall clock (9X each)      Manual Therapy   Manual Therapy  Soft tissue mobilization    Manual therapy comments  Manual therapy completed prior to exercises.     Soft tissue mobilization  Myofasical release completed to left AC joint and deltoid region to decrease fascial restrictions and allow for a pain free ROM.                OT Short Term Goals - 03/17/18 0919      OT SHORT TERM GOAL #1   Title  Patient will be educated and independent with HEP in order to faciliate progress in therapy and return to using his left UE as his dominant with increased comfort.     Time  4    Period  Weeks    Status  On-going    Target Date  04/09/18      OT SHORT TERM GOAL #2   Title  Patient will increase increased comfort in his LUE with a decreased pain score of 2/10 or less with use and during HEP.     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     Time  4    Period  Weeks    Status  On-going      OT SHORT TERM GOAL #4    Title  Patient will be educated on self myofascial and trigger point release in order to decrease fascial restrictions in his LUE to trace amount in order to decrease pain and discomfort during functional tasks.     Time  4    Period  Weeks    Status  On-going               Plan - 03/31/18 1101    Clinical Impression Statement  A: Patient showing progress with middle trapezius and scapular strengthening exercises. Continues to require min VC for form and technique. Increased sidelying strengthening repetitiongs to 15X for all execpt flexion due to patient's report of muscle tightening.     Body Structure / Function / Physical Skills  ADL;Strength;Pain;UE functional use;Fascial restriction    Plan  P: reassessment and discharge if appropriate. D/C myofascial release.     Consulted and Agree with Plan of Care  Patient       Patient will benefit from skilled therapeutic intervention in order to improve the following deficits and impairments:  Body Structure / Function / Physical Skills  Visit Diagnosis: Other symptoms and signs involving the musculoskeletal system  Acute pain of left shoulder    Problem List Patient Active Problem List   Diagnosis Date Noted  . Social phobia 08/04/2016    Ailene Ravel, OTR/L,CBIS  [phone]  03/31/2018, 11:07 AM  [place] [address] [place], [place], [address] Phone: [phone]   Fax:  [phone]  Name: Jeffrey Compton MRN: [id number] Date of Birth: Dec 21, 1980

## 2018-04-02 DIAGNOSIS — Z029 Encounter for administrative examinations, unspecified: Secondary | ICD-10-CM

## 2018-04-06 ENCOUNTER — Telehealth (HOSPITAL_COMMUNITY): Payer: Self-pay

## 2018-04-06 NOTE — Telephone Encounter (Signed)
Pt is limiting his outtings due to coronavirus and r/s for next week. NF 04/06/2018

## 2018-04-07 ENCOUNTER — Ambulatory Visit (HOSPITAL_COMMUNITY): Payer: Medicare HMO

## 2018-04-09 ENCOUNTER — Telehealth (HOSPITAL_COMMUNITY): Payer: Self-pay | Admitting: Occupational Therapy

## 2018-04-09 NOTE — Telephone Encounter (Signed)
Called and left message for pt regarding 2 week clinic closure for COVID-19 precautions. Asked to return call to discuss HEP and reschedule missed appts.    Guadelupe Sabin, OTR/L  [phone] 04/09/2018

## 2018-04-14 ENCOUNTER — Ambulatory Visit (HOSPITAL_COMMUNITY): Payer: Medicare HMO

## 2018-06-28 ENCOUNTER — Encounter (HOSPITAL_COMMUNITY): Payer: Self-pay

## 2018-06-28 NOTE — Therapy (Signed)
[place], [place], [address] Phone: [phone]   Fax:  [phone]  Patient Details  Name: Jeffrey Compton MRN: [id number] Date of Birth: 1980-04-01 Referring Provider:  No ref. provider found  Encounter Date: 06/28/2018  OCCUPATIONAL THERAPY DISCHARGE SUMMARY  Visits from Start of Care: 4  Current functional level related to goals / functional outcomes:  OT SHORT TERM GOAL #1   Title  Patient will be educated and independent with HEP in order to faciliate progress in therapy and return to using his left UE as his dominant with increased comfort.     Time  4    Period  Weeks    Status  On-going    Target Date  04/09/18        OT SHORT TERM GOAL #2   Title  Patient will increase increased comfort in his LUE with a decreased pain score of 2/10 or less with use and during HEP.     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     Time  4    Period  Weeks    Status  On-going        OT SHORT TERM GOAL #4   Title  Patient will be educated on self myofascial and trigger point release in order to decrease fascial restrictions in his LUE to trace amount in order to decrease pain and discomfort during functional tasks.     Time  4    Period  Weeks    Status  On-going       Remaining deficits: Unknown. Patient was planned for discharge at next appointment prior to clinic closure for COVID-19.    Education / Equipment: Shoulder and scapular stability HEP. Plan: Patient agrees to discharge.  Patient goals were met. Patient is being discharged due to not returning since the last visit.  ?????          Ailene Ravel, OTR/L,CBIS  [phone]  06/28/2018, 2:06 PM  [place] [address] [place], [place], [address] Phone: [phone]   Fax:  [phone]

## 2018-07-01 ENCOUNTER — Ambulatory Visit (INDEPENDENT_AMBULATORY_CARE_PROVIDER_SITE_OTHER): Payer: Medicare HMO | Admitting: Otolaryngology

## 2018-07-01 DIAGNOSIS — H608X1 Other otitis externa, right ear: Secondary | ICD-10-CM

## 2018-07-01 DIAGNOSIS — H6121 Impacted cerumen, right ear: Secondary | ICD-10-CM | POA: Diagnosis not present

## 2018-08-18 DIAGNOSIS — H5200 Hypermetropia, unspecified eye: Secondary | ICD-10-CM | POA: Diagnosis not present

## 2018-08-19 DIAGNOSIS — R69 Illness, unspecified: Secondary | ICD-10-CM | POA: Diagnosis not present

## 2018-08-26 ENCOUNTER — Ambulatory Visit (INDEPENDENT_AMBULATORY_CARE_PROVIDER_SITE_OTHER): Payer: Medicare HMO | Admitting: Otolaryngology

## 2018-09-02 ENCOUNTER — Ambulatory Visit (INDEPENDENT_AMBULATORY_CARE_PROVIDER_SITE_OTHER): Payer: Medicare HMO | Admitting: Otolaryngology

## 2018-09-02 ENCOUNTER — Other Ambulatory Visit: Payer: Self-pay

## 2018-09-02 DIAGNOSIS — H6123 Impacted cerumen, bilateral: Secondary | ICD-10-CM

## 2018-09-28 DIAGNOSIS — L299 Pruritus, unspecified: Secondary | ICD-10-CM | POA: Diagnosis not present

## 2018-09-28 DIAGNOSIS — H6121 Impacted cerumen, right ear: Secondary | ICD-10-CM | POA: Insufficient documentation

## 2018-09-28 DIAGNOSIS — H938X1 Other specified disorders of right ear: Secondary | ICD-10-CM | POA: Diagnosis not present

## 2018-09-28 DIAGNOSIS — H6123 Impacted cerumen, bilateral: Secondary | ICD-10-CM | POA: Diagnosis not present

## 2018-11-04 ENCOUNTER — Ambulatory Visit (INDEPENDENT_AMBULATORY_CARE_PROVIDER_SITE_OTHER): Payer: Medicare HMO | Admitting: Family Medicine

## 2018-11-04 ENCOUNTER — Other Ambulatory Visit: Payer: Self-pay

## 2018-11-04 VITALS — BP 122/84 | Temp 97.7°F | Wt 225.0 lb

## 2018-11-04 DIAGNOSIS — L03115 Cellulitis of right lower limb: Secondary | ICD-10-CM

## 2018-11-04 MED ORDER — DOXYCYCLINE HYCLATE 100 MG PO TABS: 100.0000 mg | ORAL_TABLET | Freq: Two times a day (BID) | ORAL | 0 refills | Status: DC

## 2018-11-04 MED ORDER — MUPIROCIN 2 % EX OINT: TOPICAL_OINTMENT | CUTANEOUS | 0 refills | Status: DC

## 2018-11-04 NOTE — Progress Notes (Signed)
   Subjective:    Patient ID: Jeffrey Compton, male    DOB: January 18, 1981, 38 y.o.   MRN: [id number]  HPI Pt here today for red area on leg. Pt states he hurt his leg back in 2017. He now has a red area and a scab on lower right leg. No drainage or anything per patient.  Patient with an area on right lower leg that is previously got injured back 2017 now has a red area and scab on it tender no drainage though denies any new injury.  No fever chills or sweats.  No calf pain or knee pain or ankle pain.  PMH benign patient is trying to stay active state healthy with his eating habits  Review of Systems  Constitutional: Negative for activity change, fatigue and fever.  HENT: Negative for congestion and rhinorrhea.   Respiratory: Negative for cough and shortness of breath.   Cardiovascular: Negative for chest pain and leg swelling.  Gastrointestinal: Negative for abdominal pain, diarrhea and nausea.  Genitourinary: Negative for dysuria and hematuria.  Neurological: Negative for weakness and headaches.  Psychiatric/Behavioral: Negative for agitation and behavioral problems.       Objective:    Lungs clear respiratory rate normal heart regular no murmurs lower leg there is a scar area with scab and localized cellulitis it is not severe no drainage or abscess noted       Assessment & Plan:  Patient defers on flu vaccine He has what appears to be a mild infection It is best for this patient go ahead with warm compresses frequently along with antibiotics.  Antibiotic sent and side effects discussed proper way to take it discussed in addition to this after next for 5 days I would recommend warm moist compresses 2-3 times a day to soften up the scab once the scab falls off and use Bactroban ointment once daily follow-up in the springtime for more comprehensive check

## 2019-01-11 ENCOUNTER — Ambulatory Visit: Payer: Medicare HMO | Attending: Family Medicine

## 2019-01-11 ENCOUNTER — Other Ambulatory Visit: Payer: Self-pay

## 2019-01-11 DIAGNOSIS — Z20828 Contact with and (suspected) exposure to other viral communicable diseases: Secondary | ICD-10-CM | POA: Diagnosis not present

## 2019-01-11 DIAGNOSIS — Z20822 Contact with and (suspected) exposure to covid-19: Secondary | ICD-10-CM

## 2019-01-13 ENCOUNTER — Telehealth: Payer: Self-pay | Admitting: *Deleted

## 2019-01-13 LAB — NOVEL CORONAVIRUS, NAA: SARS-CoV-2, NAA: NOT DETECTED

## 2019-01-13 NOTE — Telephone Encounter (Signed)
He called in requesting his COVID-19 test result.    I let him know it was not detected meaning he did not have the virus.

## 2019-01-17 ENCOUNTER — Encounter: Payer: Self-pay | Admitting: Family Medicine

## 2019-01-17 ENCOUNTER — Ambulatory Visit (INDEPENDENT_AMBULATORY_CARE_PROVIDER_SITE_OTHER): Payer: Medicare HMO | Admitting: Family Medicine

## 2019-01-17 DIAGNOSIS — Z20828 Contact with and (suspected) exposure to other viral communicable diseases: Secondary | ICD-10-CM | POA: Diagnosis not present

## 2019-01-17 DIAGNOSIS — Z20822 Contact with and (suspected) exposure to covid-19: Secondary | ICD-10-CM

## 2019-01-17 NOTE — Progress Notes (Signed)
   Subjective:    Patient ID: Jeffrey Compton, male    DOB: [birth year], 38 y.o.   MRN: [id number]  HPI exposure to covid at work. Coworker tested positive week before last and pt was last Tuesday and it was negative. Feels like he has the flu. No strength, nausea, runny nose, body aches, low grade fever -99, headache. Lost sense of taste and smell. Can only taste salt and that's it. Tried tylenol. Patient with a lot of symptoms of Covid Significant fatigue tiredness gets out of breath if he pushes himself but does not get out of breath if he is at low activity denies any injuries denies numbness tingling denies chest pressure tightness pain Virtual Visit via Telephone Note  I connected with [place] [name] on 01/17/19 at  3:50 PM EST by telephone and verified that I am speaking with the correct person using two identifiers.  Location: Patient: home Provider: office   I discussed the limitations, risks, security and privacy concerns of performing an evaluation and management service by telephone and the availability of in person appointments. I also discussed with the patient that there may be a patient responsible charge related to this service. The patient expressed understanding and agreed to proceed.   History of Present Illness:    Observations/Objective:   Assessment and Plan:   Follow Up Instructions:    I discussed the assessment and treatment plan with the patient. The patient was provided an opportunity to ask questions and all were answered. The patient agreed with the plan and demonstrated an understanding of the instructions.   The patient was advised to call back or seek an in-person evaluation if the symptoms worsen or if the condition fails to improve as anticipated.  I provided 17 minutes of non-face-to-face time during this encounter.       Review of Systems  Constitutional: Negative for activity change, chills and fever.  HENT: Positive for  congestion and rhinorrhea. Negative for ear pain.   Eyes: Negative for discharge.  Respiratory: Positive for cough. Negative for wheezing.   Cardiovascular: Negative for chest pain.  Gastrointestinal: Negative for nausea and vomiting.  Musculoskeletal: Negative for arthralgias.       Objective:   Physical Exam  Today's visit was via telephone Physical exam was not possible for this visit        Assessment & Plan:  Probable Covid Important for the patient to rest up I did encourage the patient to go back and get tested again Hold off on any antibiotics Warning signs were discussed Work excuse for this week Hopefully return to work next week Call us back sooner if any problems

## 2019-01-24 ENCOUNTER — Telehealth: Payer: Self-pay | Admitting: Family Medicine

## 2019-01-24 ENCOUNTER — Encounter: Payer: Self-pay | Admitting: Family Medicine

## 2019-01-24 DIAGNOSIS — H6123 Impacted cerumen, bilateral: Secondary | ICD-10-CM | POA: Diagnosis not present

## 2019-01-24 NOTE — Telephone Encounter (Signed)
Please give a work excuse through the rest of this week return to work next Monday If patient still unable to return to work by next Monday I would would recommend a follow-up office visit

## 2019-01-24 NOTE — Telephone Encounter (Signed)
Patient is requesting to extend work for this week still not feeling well has fatigue and weakness. Please advise

## 2019-01-31 ENCOUNTER — Ambulatory Visit (INDEPENDENT_AMBULATORY_CARE_PROVIDER_SITE_OTHER): Payer: Medicare HMO | Admitting: Family Medicine

## 2019-01-31 ENCOUNTER — Other Ambulatory Visit: Payer: Self-pay

## 2019-01-31 DIAGNOSIS — B349 Viral infection, unspecified: Secondary | ICD-10-CM | POA: Diagnosis not present

## 2019-01-31 DIAGNOSIS — R5383 Other fatigue: Secondary | ICD-10-CM

## 2019-01-31 NOTE — Progress Notes (Signed)
   Subjective:    Patient ID: Jeffrey Compton, male    DOB: Jan 31, 1980, 39 y.o.   MRN: [id number]  HPI Pt states he is still very fatigued. Pt states this has been going on for about 3 weeks. Pt states he has not tried and treatments or any med to help with fatigue. Significant fatigue tiredness patient had what was presumed to be Covid infection earlier a few weeks ago but has had ongoing fatigue tiredness when he tries to do things denies any fever cough nausea vomiting diarrhea denies any rashes no other unusual symptoms currently.  PMH benign Virtual Visit via Telephone Note  I connected with [place] [name] on 01/31/19 at  3:50 PM EST by telephone and verified that I am speaking with the correct person using two identifiers.  Location: Patient: home Provider: office   I discussed the limitations, risks, security and privacy concerns of performing an evaluation and management service by telephone and the availability of in person appointments. I also discussed with the patient that there may be a patient responsible charge related to this service. The patient expressed understanding and agreed to proceed.   History of Present Illness:    Observations/Objective:   Assessment and Plan:   Follow Up Instructions:    I discussed the assessment and treatment plan with the patient. The patient was provided an opportunity to ask questions and all were answered. The patient agreed with the plan and demonstrated an understanding of the instructions.   The patient was advised to call back or seek an in-person evaluation if the symptoms worsen or if the condition fails to improve as anticipated.  I provided 16 minutes of non-face-to-face time during this encounter.       Review of Systems  Constitutional: Positive for fatigue. Negative for activity change and fever.  HENT: Negative for congestion and rhinorrhea.   Respiratory: Negative for cough and shortness of breath.     Cardiovascular: Negative for chest pain and leg swelling.  Gastrointestinal: Negative for abdominal pain, diarrhea and nausea.  Genitourinary: Negative for dysuria and hematuria.  Neurological: Negative for weakness and headaches.  Psychiatric/Behavioral: Negative for agitation and behavioral problems.       Objective:   Physical Exam  Today's visit was via telephone Physical exam was not possible for this visit       Assessment & Plan:  More than likely had Covid Test was negative but symptoms were very suspicious for Covid Now having what appears to be close post Covid fatigue no need for any type of x-rays at this time but I do recommend lab work await the results of this

## 2019-02-01 DIAGNOSIS — R5383 Other fatigue: Secondary | ICD-10-CM | POA: Diagnosis not present

## 2019-02-01 DIAGNOSIS — B349 Viral infection, unspecified: Secondary | ICD-10-CM | POA: Diagnosis not present

## 2019-02-02 LAB — CBC WITH DIFFERENTIAL/PLATELET
Basophils Absolute: 0 10*3/uL (ref 0.0–0.2)
Basos: 1 %
EOS (ABSOLUTE): 0.1 10*3/uL (ref 0.0–0.4)
Eos: 2 %
Hematocrit: 45.8 % (ref 37.5–51.0)
Hemoglobin: 15.4 g/dL (ref 13.0–17.7)
Immature Grans (Abs): 0 10*3/uL (ref 0.0–0.1)
Immature Granulocytes: 0 %
Lymphocytes Absolute: 2 10*3/uL (ref 0.7–3.1)
Lymphs: 37 %
MCH: 30.4 pg (ref 26.6–33.0)
MCHC: 33.6 g/dL (ref 31.5–35.7)
MCV: 91 fL (ref 79–97)
Monocytes Absolute: 0.3 10*3/uL (ref 0.1–0.9)
Monocytes: 5 %
Neutrophils Absolute: 3 10*3/uL (ref 1.4–7.0)
Neutrophils: 55 %
Platelets: 263 10*3/uL (ref 150–450)
RBC: 5.06 x10E6/uL (ref 4.14–5.80)
RDW: 12.6 % (ref 11.6–15.4)
WBC: 5.4 10*3/uL (ref 3.4–10.8)

## 2019-02-03 ENCOUNTER — Other Ambulatory Visit: Payer: Self-pay | Admitting: *Deleted

## 2019-02-03 ENCOUNTER — Telehealth: Payer: Self-pay | Admitting: Family Medicine

## 2019-02-03 DIAGNOSIS — R5383 Other fatigue: Secondary | ICD-10-CM

## 2019-02-03 NOTE — Telephone Encounter (Signed)
Called pt because cbc was done but not cmp. I advised him that dr Nicki Reaper said cbc was good and he was waiting on cmp. Called [facility] and cmp was never done because it was put in for external lab and not [facility] so lab did not receive the order. Lab could not add the test either. I put in a new order for [facility] and called and explained to pt what happened and he states he will go tomorrow.

## 2019-02-03 NOTE — Telephone Encounter (Signed)
Pt calling checking on lab results.

## 2019-02-08 DIAGNOSIS — H60331 Swimmer's ear, right ear: Secondary | ICD-10-CM | POA: Insufficient documentation

## 2019-02-08 DIAGNOSIS — D2321 Other benign neoplasm of skin of right ear and external auricular canal: Secondary | ICD-10-CM | POA: Diagnosis not present

## 2019-02-17 ENCOUNTER — Telehealth: Payer: Self-pay | Admitting: Family Medicine

## 2019-02-17 NOTE — Telephone Encounter (Signed)
pts dad calling to find out how to find out blood type.

## 2019-02-17 NOTE — Telephone Encounter (Signed)
Pt informed that blood type could be retrieved by giving blood. Pt verbalized understanding.

## 2019-03-02 DIAGNOSIS — Z0000 Encounter for general adult medical examination without abnormal findings: Secondary | ICD-10-CM | POA: Diagnosis not present

## 2019-03-02 DIAGNOSIS — E6609 Other obesity due to excess calories: Secondary | ICD-10-CM | POA: Diagnosis not present

## 2019-03-02 DIAGNOSIS — R69 Illness, unspecified: Secondary | ICD-10-CM | POA: Diagnosis not present

## 2019-03-02 DIAGNOSIS — Z6832 Body mass index (BMI) 32.0-32.9, adult: Secondary | ICD-10-CM | POA: Diagnosis not present

## 2019-03-02 DIAGNOSIS — Z1389 Encounter for screening for other disorder: Secondary | ICD-10-CM | POA: Diagnosis not present

## 2019-03-15 ENCOUNTER — Ambulatory Visit: Payer: Medicare HMO | Admitting: Family Medicine

## 2019-03-16 ENCOUNTER — Ambulatory Visit: Payer: Medicare HMO | Attending: Internal Medicine

## 2019-03-16 ENCOUNTER — Ambulatory Visit (INDEPENDENT_AMBULATORY_CARE_PROVIDER_SITE_OTHER): Payer: Medicare HMO | Admitting: Family Medicine

## 2019-03-16 ENCOUNTER — Other Ambulatory Visit: Payer: Self-pay

## 2019-03-16 DIAGNOSIS — J4521 Mild intermittent asthma with (acute) exacerbation: Secondary | ICD-10-CM

## 2019-03-16 DIAGNOSIS — J019 Acute sinusitis, unspecified: Secondary | ICD-10-CM

## 2019-03-16 DIAGNOSIS — Z20822 Contact with and (suspected) exposure to covid-19: Secondary | ICD-10-CM | POA: Diagnosis not present

## 2019-03-16 MED ORDER — ALBUTEROL SULFATE HFA 108 (90 BASE) MCG/ACT IN AERS: 2.0000 | INHALATION_SPRAY | Freq: Four times a day (QID) | RESPIRATORY_TRACT | 2 refills | Status: DC | PRN

## 2019-03-16 MED ORDER — PREDNISONE 20 MG PO TABS: ORAL_TABLET | ORAL | 0 refills | Status: DC

## 2019-03-16 MED ORDER — AZITHROMYCIN 250 MG PO TABS: ORAL_TABLET | ORAL | 0 refills | Status: DC

## 2019-03-16 NOTE — Progress Notes (Signed)
   Subjective:    Patient ID: Jeffrey Compton, male    DOB: [birth year], 39 y.o.   MRN: [id number]  Sinusitis This is a new problem. Episode onset: Friday. Associated symptoms include congestion and coughing. Pertinent negatives include no chills or ear pain. (Productive cough, headache, not feeling good in general, low grade fever) Treatments tried: Mucinex. The treatment provided mild relief.  Patient relates low-grade fever for several days now no longer having fever relates head congestion drainage coughing sinus pressure denies wheezing difficulty breathing denies nausea vomiting diarrhea.  Does state though that he has a hard time getting a deep breath at times feels little short of breath.  PMH benign because of the difficult nature this patient was brought to the office to be seen in person  Virtual Visit via Telephone Note  I connected with  [name] on 03/16/19 at  8:30 AM EST by telephone and verified that I am speaking with the correct person using two identifiers.  Location: Patient: home Provider: office   I discussed the limitations, risks, security and privacy concerns of performing an evaluation and management service by telephone and the availability of in person appointments. I also discussed with the patient that there may be a patient responsible charge related to this service. The patient expressed understanding and agreed to proceed.   History of Present Illness:    Observations/Objective:   Assessment and Plan:   Follow Up Instructions:    I discussed the assessment and treatment plan with the patient. The patient was provided an opportunity to ask questions and all were answered. The patient agreed with the plan and demonstrated an understanding of the instructions.   The patient was advised to call back or seek an in-person evaluation if the symptoms worsen or if the condition fails to improve as anticipated.  I provided 20 minutes of  non-face-to-face time during this encounter.      Review of Systems  Constitutional: Negative for activity change, chills and fever.  HENT: Positive for congestion and rhinorrhea. Negative for ear pain.   Eyes: Negative for discharge.  Respiratory: Positive for cough and wheezing.   Cardiovascular: Negative for chest pain.  Gastrointestinal: Negative for nausea and vomiting.  Musculoskeletal: Negative for arthralgias.       Objective:   Physical Exam Neck no masses lungs bilateral expiratory wheezes in the bases no rales or rhonchi respiratory rate normal O2 saturation 96% heart rate 109 temperature 98 heart regular but slightly elevated rate extremities no edema skin warm dry       Assessment & Plan:  Mild sinusitis along with reactive airway Covid testing recommended Follow-up if progressive troubles or worse if worse go to ER Warning signs discussed in detail Prednisone over the next 5 days albuterol 2 puffs every 4 hours plus also use as needed plus Z-Pak as directed Covid testing pending

## 2019-03-17 LAB — NOVEL CORONAVIRUS, NAA: SARS-CoV-2, NAA: NOT DETECTED

## 2019-03-18 ENCOUNTER — Telehealth: Payer: Self-pay | Admitting: Family Medicine

## 2019-03-18 NOTE — Telephone Encounter (Signed)
Pt is aware covid 19 test is neg on 03-18-2019

## 2019-04-07 ENCOUNTER — Other Ambulatory Visit: Payer: Self-pay | Admitting: *Deleted

## 2019-04-07 MED ORDER — ALBUTEROL SULFATE HFA 108 (90 BASE) MCG/ACT IN AERS: 2.0000 | INHALATION_SPRAY | Freq: Four times a day (QID) | RESPIRATORY_TRACT | 2 refills | Status: DC | PRN

## 2019-04-08 DIAGNOSIS — H60331 Swimmer's ear, right ear: Secondary | ICD-10-CM | POA: Diagnosis not present

## 2019-04-08 DIAGNOSIS — H938X3 Other specified disorders of ear, bilateral: Secondary | ICD-10-CM | POA: Diagnosis not present

## 2019-04-08 DIAGNOSIS — L299 Pruritus, unspecified: Secondary | ICD-10-CM | POA: Diagnosis not present

## 2019-04-14 ENCOUNTER — Encounter: Payer: Self-pay | Admitting: Family Medicine

## 2019-04-14 ENCOUNTER — Other Ambulatory Visit: Payer: Self-pay

## 2019-04-14 ENCOUNTER — Ambulatory Visit (INDEPENDENT_AMBULATORY_CARE_PROVIDER_SITE_OTHER): Payer: Medicare HMO | Admitting: Family Medicine

## 2019-04-14 DIAGNOSIS — K529 Noninfective gastroenteritis and colitis, unspecified: Secondary | ICD-10-CM | POA: Diagnosis not present

## 2019-04-14 MED ORDER — ONDANSETRON 4 MG PO TBDP: ORAL_TABLET | ORAL | 0 refills | Status: DC

## 2019-04-14 NOTE — Progress Notes (Signed)
   Subjective:  Audio plus video  Patient ID: Jeffrey Compton, male    DOB: 1980/09/28, 39 y.o.   MRN: [id number]  Abdominal Pain This is a new problem. Episode onset: last night  The pain is located in the generalized abdominal region. Associated symptoms include vomiting. He has tried nothing for the symptoms.   Virtual Visit via Telephone Note  I connected with  [name] on 04/14/19 at  3:00 PM EDT by telephone and verified that I am speaking with the correct person using two identifiers.  Location: Patient: home Provider: office   I discussed the limitations, risks, security and privacy concerns of performing an evaluation and management service by telephone and the availability of in person appointments. I also discussed with the patient that there may be a patient responsible charge related to this service. The patient expressed understanding and agreed to proceed.   History of Present Illness:    Observations/Objective:   Assessment and Plan:   Follow Up Instructions:    I discussed the assessment and treatment plan with the patient. The patient was provided an opportunity to ask questions and all were answered. The patient agreed with the plan and demonstrated an understanding of the instructions.   The patient was advised to call back or seek an in-person evaluation if the symptoms worsen or if the condition fails to improve as anticipated.  I provide35minutes of non-face-to-face time during this encounter.   Back to work a couple months  Having quite a bit of stress,  No diarrhea today  abd feels weak and cramping   Vomited times two  Works at the [employer]  Gastrointestinal: Positive for abdominal pain and vomiting.       Objective:   Physical Exam  Virtual      Assessment & Plan:  Impression acute gastroenteritis presentation.  Somewhat improved today.  We will add Zofran.  Warning signs discussed.  Symptom care  discussed.  Patient quickly pivoted to the fact that he is autism spectrum, and that he think his acute GI symptoms are due to stress, no suicidal or homicidal thoughts.  States he has been back in the workforce and feels he just cannot go any further with work.  I told him I could write him a work excuse for acute sickness but his own physician would need to work with him in terms of any discussion regarding disability/long-term inability to work.  Schedule follow-up with Dr. Nicki Reaper.

## 2019-04-15 ENCOUNTER — Ambulatory Visit (INDEPENDENT_AMBULATORY_CARE_PROVIDER_SITE_OTHER): Payer: Medicare HMO | Admitting: Family Medicine

## 2019-04-15 DIAGNOSIS — R69 Illness, unspecified: Secondary | ICD-10-CM | POA: Diagnosis not present

## 2019-04-15 DIAGNOSIS — F411 Generalized anxiety disorder: Secondary | ICD-10-CM | POA: Diagnosis not present

## 2019-04-15 MED ORDER — LORAZEPAM 0.5 MG PO TABS: ORAL_TABLET | ORAL | 1 refills | Status: DC

## 2019-04-15 MED ORDER — SERTRALINE HCL 50 MG PO TABS: 50.0000 mg | ORAL_TABLET | Freq: Every day | ORAL | 2 refills | Status: DC

## 2019-04-15 NOTE — Progress Notes (Signed)
   Subjective:    Patient ID: Jeffrey Compton, male    DOB: Mar 20, 1980, 39 y.o.   MRN: [id number]  HPIAnxiety for the past couple of weeks.  GAD 7 : Generalized Anxiety Score 04/15/2019 07/14/2017  Nervous, Anxious, on Edge 3 2  Control/stop worrying 3 2  Worry too much - different things 3 1  Trouble relaxing 3 3  Restless 3 1  Easily annoyed or irritable 0 0  Afraid - awful might happen 0 2  Total GAD 7 Score 15 11  Anxiety Difficulty Extremely difficult Very difficult    PHQ9 SCORE ONLY 04/15/2019 07/14/2017 07/21/2016  Score 5 16 11    Patient denies depression but may find himself feeling anxious nervous feeling stressed about having to get up so early for his job he has autism which makes it difficult for him to adapt to various situations.  He states he is not suicidal or homicidal Gad 7 and phq9 done.   Virtual Visit via Telephone Note  I connected with [place] [name] on 04/15/19 at  3:00 PM EDT by telephone and verified that I am speaking with the correct person using two identifiers.  Location: Patient: home Provider: office   I discussed the limitations, risks, security and privacy concerns of performing an evaluation and management service by telephone and the availability of in person appointments. I also discussed with the patient that there may be a patient responsible charge related to this service. The patient expressed understanding and agreed to proceed.   History of Present Illness:    Observations/Objective:   Assessment and Plan:   Follow Up Instructions:    I discussed the assessment and treatment plan with the patient. The patient was provided an opportunity to ask questions and all were answered. The patient agreed with the plan and demonstrated an understanding of the instructions.   The patient was advised to call back or seek an in-person evaluation if the symptoms worsen or if the condition fails to improve as anticipated.  I provided 18  minutes of non-face-to-face time during this encounter.      Review of Systems     Objective:   Physical Exam  Virtual exam unable to do physical exam      Assessment & Plan:  Significant anxiety related into his work but also to some degree for no obvious reason we had a long discussion regarding this patient not suicidal does agree to try medication this was started in addition to this patient will do follow-up with [place] in several weeks Referral for counseling Lorazepam at nighttime to help with sleep use If becoming depressed suicidal or other problems immediately reach out for help or go to ER

## 2019-04-19 ENCOUNTER — Encounter: Payer: Self-pay | Admitting: Family Medicine

## 2019-04-27 DIAGNOSIS — R69 Illness, unspecified: Secondary | ICD-10-CM | POA: Diagnosis not present

## 2019-04-28 ENCOUNTER — Encounter: Payer: Self-pay | Admitting: Family Medicine

## 2019-04-28 ENCOUNTER — Ambulatory Visit (INDEPENDENT_AMBULATORY_CARE_PROVIDER_SITE_OTHER): Payer: Medicare HMO | Admitting: Family Medicine

## 2019-04-28 ENCOUNTER — Other Ambulatory Visit: Payer: Self-pay

## 2019-04-28 DIAGNOSIS — R69 Illness, unspecified: Secondary | ICD-10-CM | POA: Diagnosis not present

## 2019-04-28 DIAGNOSIS — F321 Major depressive disorder, single episode, moderate: Secondary | ICD-10-CM | POA: Diagnosis not present

## 2019-04-28 DIAGNOSIS — F411 Generalized anxiety disorder: Secondary | ICD-10-CM | POA: Diagnosis not present

## 2019-04-28 DIAGNOSIS — R5383 Other fatigue: Secondary | ICD-10-CM | POA: Diagnosis not present

## 2019-04-28 NOTE — Progress Notes (Signed)
   Subjective:    Patient ID: Jeffrey Compton, male    DOB: 10-08-1980, 39 y.o.   MRN: [id number]  HPI Pt here for follow up on anxiety. Pt states he is doing ok but does have questions regarding his anti-depressant. Pt is taking Zoloft 50 mg daily. Pt states he would like to discuss questions he has with provider only.  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  Location: Patient: home Provider: office   I discussed the limitations of evaluation and management by telemedicine and the availability of in person appointments. The patient expressed understanding and agreed to proceed.  History of Present Illness:    Observations/Objective:   Assessment and Plan:   Follow Up Instructions:    I discussed the assessment and treatment plan with the patient. The patient was provided an opportunity to ask questions and all were answered. The patient agreed with the plan and demonstrated an understanding of the instructions.   The patient was advised to call back or seek an in-person evaluation if the symptoms worsen or if the condition fails to improve as anticipated.  I provided 20 minutes of non-face-to-face time during this encounter.       Review of Systems  Constitutional: Negative for activity change.  HENT: Negative for congestion and rhinorrhea.   Respiratory: Negative for  cough and shortness of breath.   Cardiovascular: Negative for chest pain.  Gastrointestinal: Negative for abdominal pain, diarrhea, nausea and vomiting.  Genitourinary: Negative for dysuria and hematuria.  Neurological: Negative for weakness and headaches.  Psychiatric/Behavioral: Negative for behavioral problems and confusion.       Objective:   Physical Exam   Today's visit was via telephone Physical exam was not possible for this visit  Patient will do some lab work to rule out other issues causing fatigue    Assessment & Plan:  Mild depression with anxiety He would like to continue the current dose I explained to him how it can take several weeks for the medication to see positive benefit He would like to do a follow-up in 1 month We will do that and follow-up sooner if problems  A referral was sent 2 weeks ago for counseling he has not heard anything from them at this point we will resend the referral

## 2019-05-25 ENCOUNTER — Telehealth: Payer: Self-pay | Admitting: *Deleted

## 2019-05-25 ENCOUNTER — Other Ambulatory Visit: Payer: Self-pay

## 2019-05-25 ENCOUNTER — Encounter: Payer: Self-pay | Admitting: Family Medicine

## 2019-05-25 ENCOUNTER — Telehealth (INDEPENDENT_AMBULATORY_CARE_PROVIDER_SITE_OTHER): Payer: Medicare HMO | Admitting: Family Medicine

## 2019-05-25 DIAGNOSIS — F411 Generalized anxiety disorder: Secondary | ICD-10-CM | POA: Diagnosis not present

## 2019-05-25 DIAGNOSIS — R69 Illness, unspecified: Secondary | ICD-10-CM | POA: Diagnosis not present

## 2019-05-25 MED ORDER — SERTRALINE HCL 100 MG PO TABS: 100.0000 mg | ORAL_TABLET | Freq: Every day | ORAL | 3 refills | Status: DC

## 2019-05-25 MED ORDER — LORAZEPAM 0.5 MG PO TABS: ORAL_TABLET | ORAL | 1 refills | Status: DC

## 2019-05-25 NOTE — Addendum Note (Signed)
Addended by: Carmelina Noun on: 05/25/2019 09:35 AM   Modules accepted: Orders

## 2019-05-25 NOTE — Telephone Encounter (Signed)
Mr. tymir, oneal are scheduled for a virtual visit with your provider today.    Just as we do with appointments in the office, we must obtain your consent to participate.  Your consent will be active for this visit and any virtual visit you may have with one of our providers in the next 365 days.    If you have a MyChart account, I can also send a copy of this consent to you electronically.  All virtual visits are billed to your insurance company just like a traditional visit in the office.  As this is a virtual visit, video technology does not allow for your provider to perform a traditional examination.  This may limit your provider's ability to fully assess your condition.  If your provider identifies any concerns that need to be evaluated in person or the need to arrange testing such as labs, EKG, etc, we will make arrangements to do so.    Although advances in technology are sophisticated, we cannot ensure that it will always work on either your end or our end.  If the connection with a video visit is poor, we may have to switch to a telephone visit.  With either a video or telephone visit, we are not always able to ensure that we have a secure connection.   I need to obtain your verbal consent now.   Are you willing to proceed with your visit today?   WILMAR BRUNEY has provided verbal consent on 05/25/2019 for a virtual visit (video or telephone).   Dayton Bailiff, LPN [id number]  [phone] AM

## 2019-05-25 NOTE — Progress Notes (Signed)
   Subjective:    Patient ID: Jeffrey Compton, male    DOB: [birth year], 39 y.o.   MRN: [id number]  HPIfollow up on anxiety and depression.  Taking sertraline and ativan. Pt states he is doing well with the meds and no concerns.   Virtual Visit via Telephone Note  I connected with Jeffrey Compton on 05/25/19 at  8:20 AM EDT by telephone and verified that I am speaking with the correct person using two identifiers.  Location: Patient: home Provider: office   I discussed the limitations, risks, security and privacy concerns of performing an evaluation and management service by telephone and the availability of in person appointments. I also discussed with the patient that there may be a patient responsible charge related to this service. The patient expressed understanding and agreed to proceed.   History of Present Illness:    Observations/Objective:   Assessment and Plan:   Follow Up Instructions:    I discussed the assessment and treatment plan with the patient. The patient was provided an opportunity to ask questions and all were answered. The patient agreed with the plan and demonstrated an understanding of the instructions.   The patient was advised to call back or seek an in-person evaluation if the symptoms worsen or if the condition fails to improve as anticipated.  I provided 20 minutes of non-face-to-face time during this encounter.  Patient not suicidal Patient states fatigue tired low energy not really motivated to do much.  Is willing to go up on the dose of the medicine.  Currently on 50 mg tolerating well Not have any use of lorazepam much at all   Review of Systems    Please see above Objective:   Physical Exam Today's visit was via telephone Physical exam was not possible for this visit        Assessment & Plan:  Anxiety and depression We will bump up the dose of the sertraline 100 mg Follow-up again in 6 weeks Patient still has not heard  from behavioral health we will reconnect with them again. Patient will let us know if he does not hear from them within the next 10 to 14 days

## 2019-07-18 DIAGNOSIS — H6123 Impacted cerumen, bilateral: Secondary | ICD-10-CM | POA: Diagnosis not present

## 2019-07-20 ENCOUNTER — Ambulatory Visit (INDEPENDENT_AMBULATORY_CARE_PROVIDER_SITE_OTHER): Payer: Medicare HMO | Admitting: Clinical

## 2019-07-20 ENCOUNTER — Other Ambulatory Visit: Payer: Self-pay

## 2019-07-20 DIAGNOSIS — F418 Other specified anxiety disorders: Secondary | ICD-10-CM | POA: Diagnosis not present

## 2019-07-20 DIAGNOSIS — F322 Major depressive disorder, single episode, severe without psychotic features: Secondary | ICD-10-CM

## 2019-07-20 DIAGNOSIS — R69 Illness, unspecified: Secondary | ICD-10-CM | POA: Diagnosis not present
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
## 2019-08-04 DIAGNOSIS — Z6834 Body mass index (BMI) 34.0-34.9, adult: Secondary | ICD-10-CM | POA: Diagnosis not present

## 2019-08-04 DIAGNOSIS — L239 Allergic contact dermatitis, unspecified cause: Secondary | ICD-10-CM | POA: Diagnosis not present

## 2019-08-04 DIAGNOSIS — E6609 Other obesity due to excess calories: Secondary | ICD-10-CM | POA: Diagnosis not present

## 2019-08-20 ENCOUNTER — Telehealth (HOSPITAL_COMMUNITY): Payer: Medicare HMO | Admitting: Psychiatry

## 2019-08-25 DIAGNOSIS — H6123 Impacted cerumen, bilateral: Secondary | ICD-10-CM | POA: Diagnosis not present

## 2019-09-02 IMAGING — CR DG SHOULDER 1V*L*
1 series · 2 of 2 positions shown · non-contrast
Comparison: 02/17/2017

CLINICAL DATA: Post reduction

EXAM:
LEFT SHOULDER - 1 VIEW

[Series 2: scapula y · 0.17mm/px · 2 of 2 slices shown]
[im 1/2]
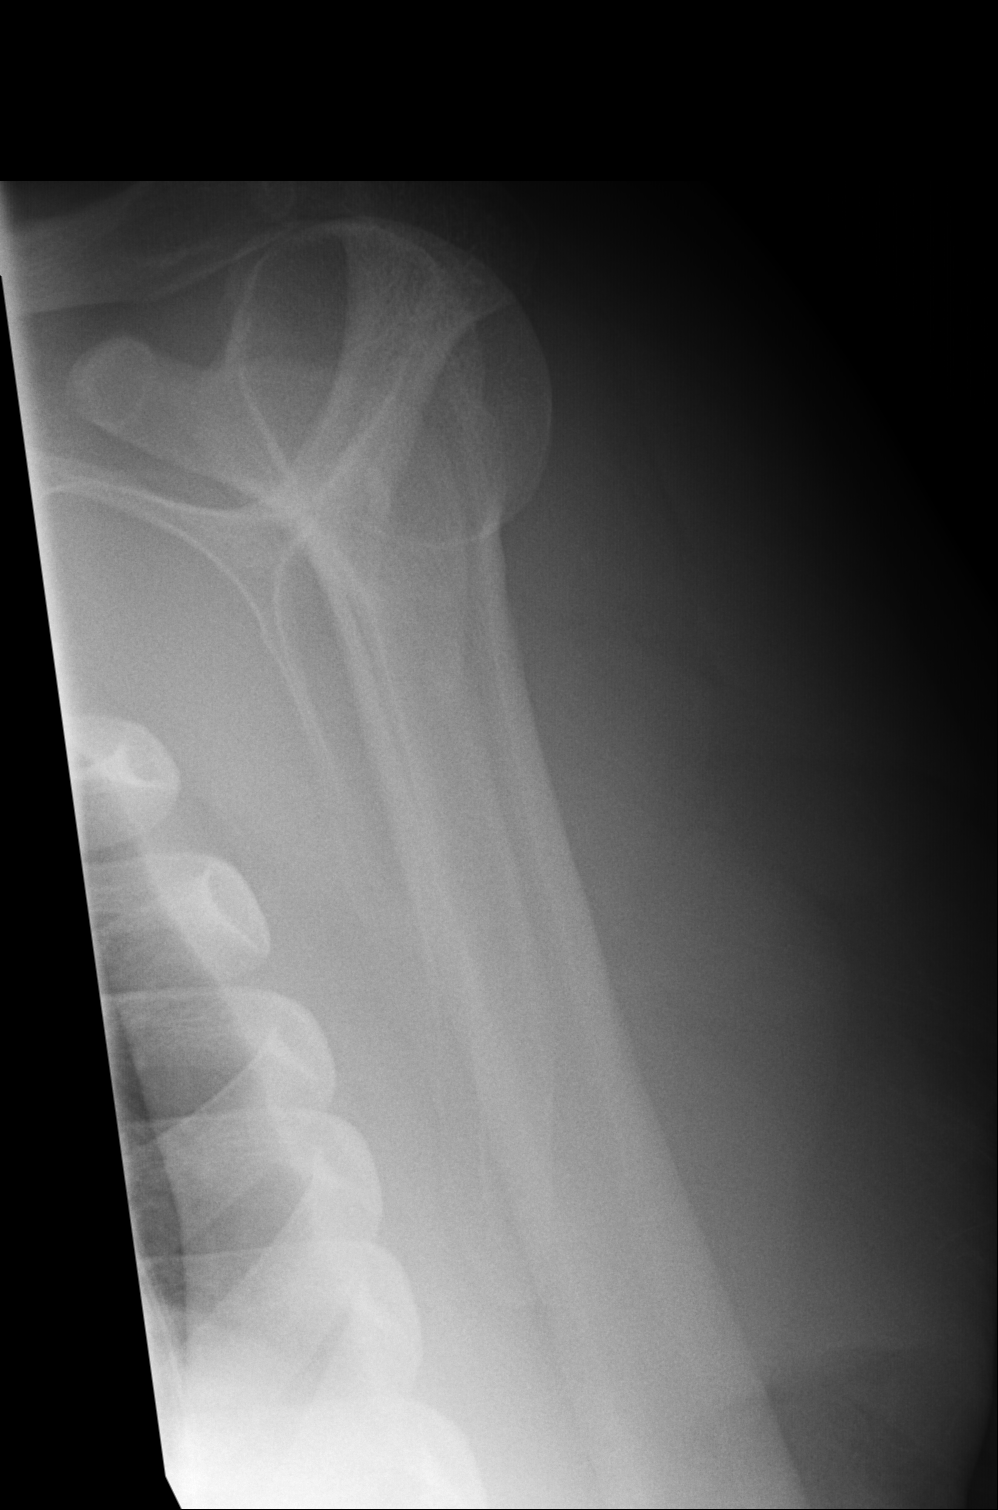
[im 2/2]
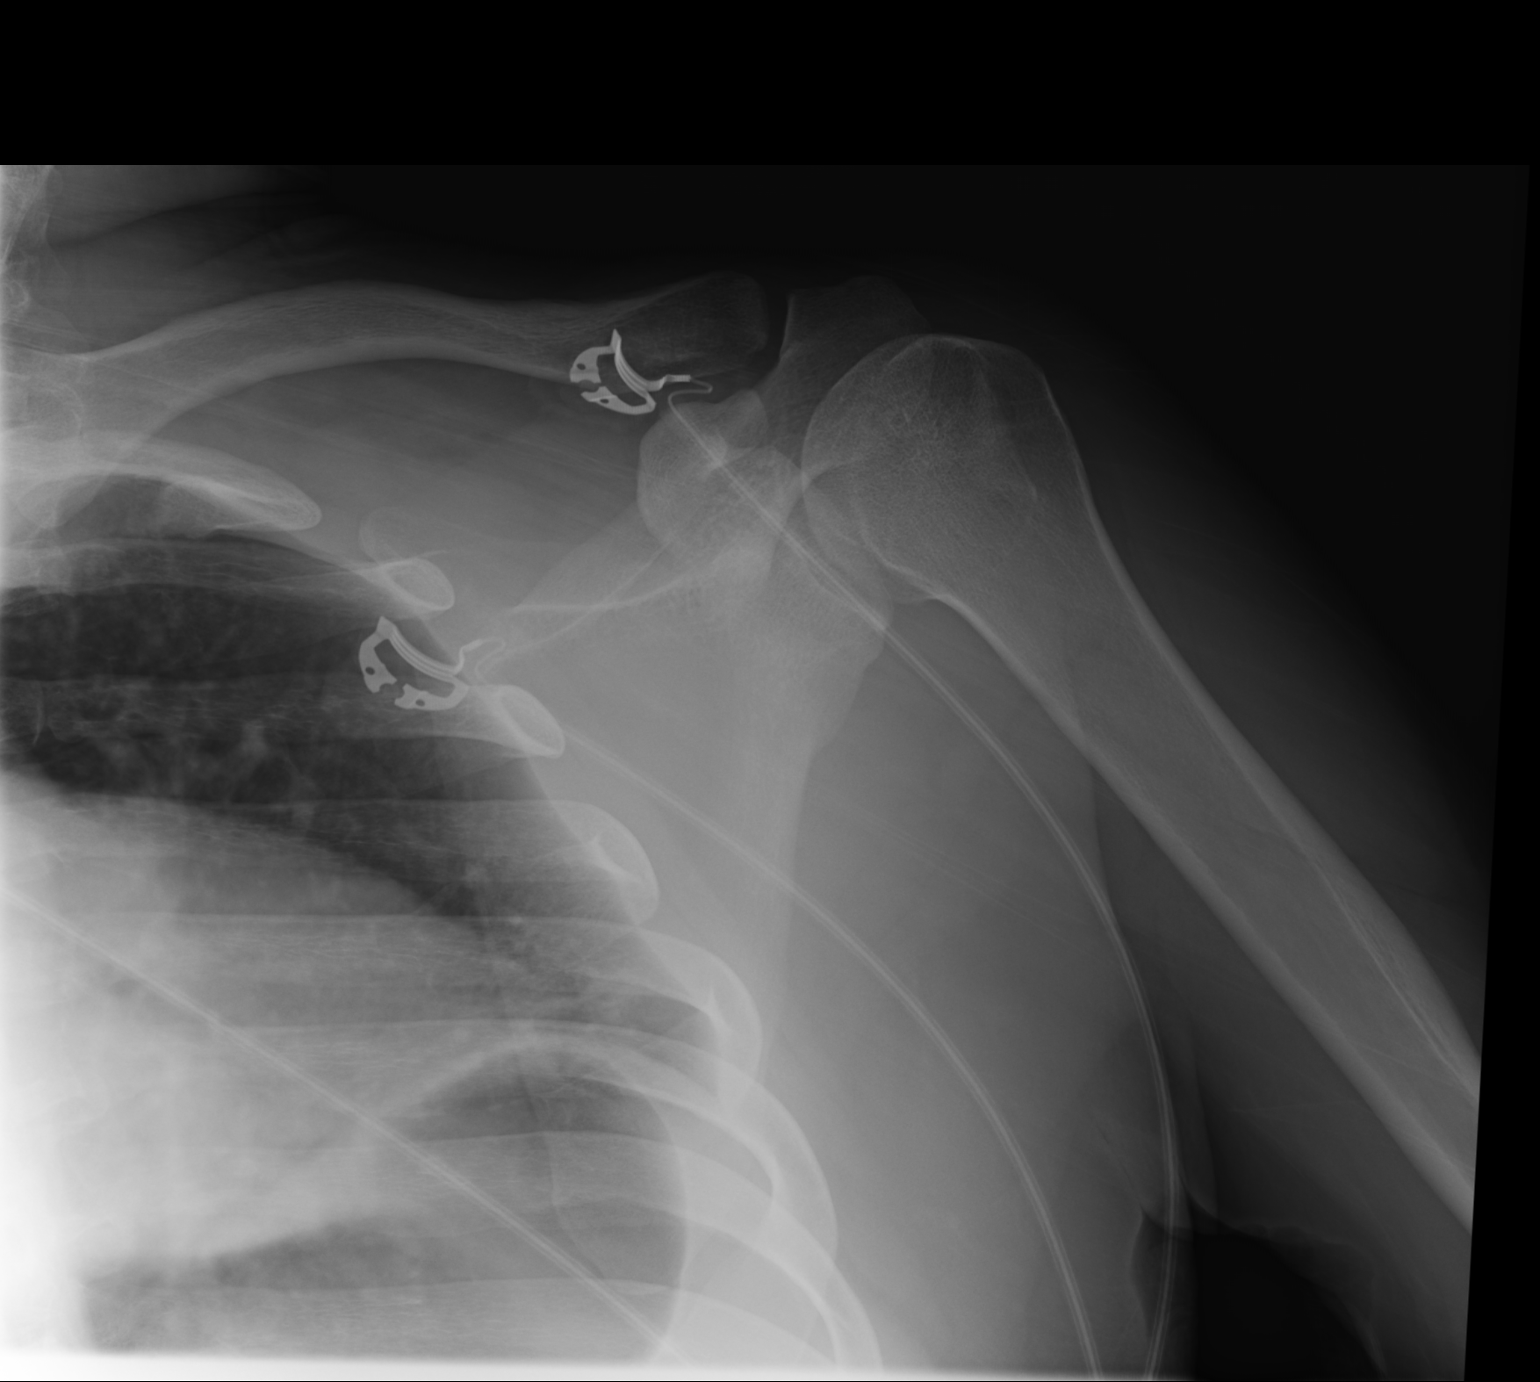

[2 of 2 positions shown; findings below may reference images not displayed]

FINDINGS: There is improved alignment following reduction. No residual
subluxation or dislocation. No acute osseous finding.
IMPRESSION: Normal alignment following reduction.

## 2019-09-06 DIAGNOSIS — H6121 Impacted cerumen, right ear: Secondary | ICD-10-CM | POA: Diagnosis not present

## 2019-10-17 ENCOUNTER — Other Ambulatory Visit: Payer: Self-pay

## 2019-10-17 ENCOUNTER — Encounter: Payer: Self-pay | Admitting: Family Medicine

## 2019-10-17 ENCOUNTER — Ambulatory Visit (INDEPENDENT_AMBULATORY_CARE_PROVIDER_SITE_OTHER): Payer: Medicare HMO | Admitting: Family Medicine

## 2019-10-17 VITALS — BP 148/88 | HR 106 | Temp 97.9°F | Wt 241.2 lb

## 2019-10-17 DIAGNOSIS — L237 Allergic contact dermatitis due to plants, except food: Secondary | ICD-10-CM | POA: Diagnosis not present

## 2019-10-17 MED ORDER — METHYLPREDNISOLONE ACETATE 40 MG/ML IJ SUSP
40.0000 mg | Freq: Once | INTRAMUSCULAR | Status: AC
  Administered 2019-10-17: 40 mg via INTRAMUSCULAR

## 2019-10-17 MED ORDER — PREDNISONE 20 MG PO TABS: ORAL_TABLET | ORAL | 0 refills | Status: DC

## 2019-10-17 NOTE — Progress Notes (Signed)
   Subjective:    Patient ID: Jeffrey Compton, male    DOB: [birth year], 39 y.o.   MRN: [id number]  Poison Karlene Einstein This is a new problem. Episode onset: thursday. The affected locations include the right arm, left arm, right hand and left hand. Past treatments include nothing.   Significant poison ivy on the hands upper arm as well as the legs and groin denies any other troubles with this.  States overall energy level doing okay.  No Covid symptoms.  Patient has had his first shot   Review of Systems Please see above    Objective:   Physical Exam  Rash noted on the hands also on the upper arms       Assessment & Plan:  Contact dermatitis Patient interested in shot of steroids as well as steroid taper these were given Warning signs discussed follow-up if ongoing troubles  Wellness exam recommended within the next 4 months

## 2019-10-20 DIAGNOSIS — Z6834 Body mass index (BMI) 34.0-34.9, adult: Secondary | ICD-10-CM | POA: Diagnosis not present

## 2019-10-20 DIAGNOSIS — R5383 Other fatigue: Secondary | ICD-10-CM | POA: Diagnosis not present

## 2019-10-20 DIAGNOSIS — R2230 Localized swelling, mass and lump, unspecified upper limb: Secondary | ICD-10-CM | POA: Diagnosis not present

## 2019-10-24 DIAGNOSIS — E782 Mixed hyperlipidemia: Secondary | ICD-10-CM | POA: Diagnosis not present

## 2019-10-24 DIAGNOSIS — R400 Somnolence: Secondary | ICD-10-CM | POA: Diagnosis not present

## 2019-10-24 DIAGNOSIS — R69 Illness, unspecified: Secondary | ICD-10-CM | POA: Diagnosis not present

## 2019-10-24 DIAGNOSIS — R5383 Other fatigue: Secondary | ICD-10-CM | POA: Diagnosis not present

## 2019-11-14 ENCOUNTER — Encounter: Payer: Self-pay | Admitting: Family Medicine

## 2019-11-14 ENCOUNTER — Other Ambulatory Visit: Payer: Self-pay

## 2019-11-14 ENCOUNTER — Ambulatory Visit (INDEPENDENT_AMBULATORY_CARE_PROVIDER_SITE_OTHER): Payer: Medicare HMO | Admitting: Family Medicine

## 2019-11-14 VITALS — BP 122/92 | HR 108 | Temp 97.8°F | Wt 240.0 lb

## 2019-11-14 DIAGNOSIS — R635 Abnormal weight gain: Secondary | ICD-10-CM | POA: Diagnosis not present

## 2019-11-14 DIAGNOSIS — D509 Iron deficiency anemia, unspecified: Secondary | ICD-10-CM

## 2019-11-14 DIAGNOSIS — R69 Illness, unspecified: Secondary | ICD-10-CM | POA: Diagnosis not present

## 2019-11-14 DIAGNOSIS — R400 Somnolence: Secondary | ICD-10-CM

## 2019-11-14 DIAGNOSIS — R5383 Other fatigue: Secondary | ICD-10-CM

## 2019-11-14 DIAGNOSIS — F320 Major depressive disorder, single episode, mild: Secondary | ICD-10-CM

## 2019-11-14 MED ORDER — TRAZODONE HCL 50 MG PO TABS
25.0000 mg | ORAL_TABLET | Freq: Every day | ORAL | 2 refills | Status: DC
Start: 2019-11-14 — End: 2020-01-24

## 2019-11-14 NOTE — Progress Notes (Signed)
Subjective:    Patient ID: Jeffrey Compton, male    DOB: 07-29-1980, 39 y.o.   MRN: [id number]  HPI Patient has complaints of fatigue for the last 6 months.  Other fatigue - Plan: TSH, CBC with Differential/Platelet, Ferritin, Comprehensive Metabolic Panel (CMET), Lipid panel, Split night study  Weight gain - Plan: TSH, CBC with Differential/Platelet, Ferritin, Comprehensive Metabolic Panel (CMET), Lipid panel  Somnolence - Plan: CBC with Differential/Platelet, Comprehensive Metabolic Panel (CMET), Split night study  Iron deficiency anemia, unspecified iron deficiency anemia type - Plan: Ferritin  Depression, major, single episode, mild (HCC)  Patient relates significant fatigue tiredness feeling rundown all the time feels like he has a heavy weight setting on him he relates he finds his energy level very low his enjoyment is low he is under a fair amount of stress he states he cannot find a job that he can function at Patient has significant social phobia Also has a lot of fatigue tiredness feeling rundown Feeling moderately depressed at times but not suicidal Finds himself at times feeling rundown fatigue. Relates he has been gaining weight does not feel like exercising does not get outside much. Does not get around other people much pandemic has affected him negatively  Patient reports insomnia for the last month. He states he will be awake for 48 hours at least once per week.    Review of Systems  Constitutional: Positive for fatigue. Negative for activity change.  HENT: Negative for congestion and rhinorrhea.   Respiratory: Negative for cough and shortness of breath.   Cardiovascular: Negative for chest pain.  Gastrointestinal: Negative for abdominal pain, diarrhea, nausea and vomiting.  Genitourinary: Negative for dysuria and hematuria.  Musculoskeletal: Positive for arthralgias and back pain.  Neurological: Negative for weakness and headaches.  Psychiatric/Behavioral:  Negative for behavioral problems and confusion.       Objective:   Physical Exam Vitals reviewed.  Cardiovascular:     Rate and Rhythm: Normal rate and regular rhythm.     Heart sounds: Normal heart sounds. No murmur heard.   Pulmonary:     Effort: Pulmonary effort is normal.     Breath sounds: Normal breath sounds.  Lymphadenopathy:     Cervical: No cervical adenopathy.  Neurological:     Mental Status: He is alert.  Psychiatric:        Behavior: Behavior normal.           Assessment & Plan:  1. Other fatigue Significant fatigue check thyroid function CBC electrolytes. Could well all be stress related - TSH - CBC with Differential/Platelet - Ferritin - Comprehensive Metabolic Panel (CMET) - Lipid panel - Split night study  2. Weight gain Weight gain related to inactivity and poor appetite and poor food choices. Patient to do better with dietary avoid sugary drinks try to stay physically active exercise some - TSH - CBC with Differential/Platelet - Ferritin - Comprehensive Metabolic Panel (CMET) - Lipid panel  3. Somnolence Daytime fatigue tiredness related to his poor sleep at night patient at risk for sleep apnea. Needs to have a sleep study await the results. He does not sleep well snores daytime fatigue tiredness - CBC with Differential/Platelet - Comprehensive Metabolic Panel (CMET) - Split night study  4. Iron deficiency anemia, unspecified iron deficiency anemia type Moderate iron deficient anemia is a concern we need to check for this - Ferritin  5. Depression, major, single episode, mild (HCC) Trazodone at nighttime referral to behavioral health for further counseling and  possible change in medications has tried Celexa in the past and did not tolerate  Recheck in 4 weeks recheck sooner if any problems

## 2019-11-15 NOTE — Addendum Note (Signed)
Addended by: Vicente Males on: 11/15/2019 10:42 AM   Modules accepted: Orders

## 2019-11-15 NOTE — Progress Notes (Signed)
11/15/19- Referral placed

## 2019-11-23 DIAGNOSIS — H5200 Hypermetropia, unspecified eye: Secondary | ICD-10-CM | POA: Diagnosis not present

## 2019-12-20 DIAGNOSIS — H6123 Impacted cerumen, bilateral: Secondary | ICD-10-CM | POA: Diagnosis not present

## 2020-01-24 ENCOUNTER — Ambulatory Visit (INDEPENDENT_AMBULATORY_CARE_PROVIDER_SITE_OTHER): Payer: Medicare HMO | Admitting: Family Medicine

## 2020-01-24 ENCOUNTER — Other Ambulatory Visit: Payer: Self-pay

## 2020-01-24 ENCOUNTER — Encounter: Payer: Self-pay | Admitting: Family Medicine

## 2020-01-24 VITALS — BP 126/84 | HR 109 | Temp 95.2°F | Wt 237.0 lb

## 2020-01-24 DIAGNOSIS — F320 Major depressive disorder, single episode, mild: Secondary | ICD-10-CM | POA: Diagnosis not present

## 2020-01-24 DIAGNOSIS — R69 Illness, unspecified: Secondary | ICD-10-CM | POA: Diagnosis not present

## 2020-01-24 MED ORDER — TRAZODONE HCL 50 MG PO TABS
25.0000 mg | ORAL_TABLET | Freq: Every day | ORAL | 2 refills | Status: DC
Start: 2020-01-24 — End: 2021-03-11

## 2020-01-24 NOTE — Progress Notes (Signed)
   Subjective:    Patient ID: Jeffrey Compton, male    DOB: 01/10/1981, 40 y.o.   MRN: [id number]  HPI Pt here for med follow up. Pt is taking 1/2 tablet of Trazodone 50 mg each night. Was taking one tablet but that seems to be to much.  Very nice patient He relates that he feels at times overwhelmed He also relates that he states he has no motivation finds himself feeling fatigued tired Depression screen [facility] 2/9 01/24/2020 11/14/2019 11/14/2019 04/15/2019 07/14/2017  Decreased Interest 0 3 0 0 2  Down, Depressed, Hopeless 0 2 1 0 3  PHQ - 2 Score 0 5 1 0 5  Altered sleeping - 3 3 3 1   Tired, decreased energy - 3 3 1 2   Change in appetite - 1 3 0 3  Feeling bad or failure about yourself  - 3 3 0 2  Trouble concentrating - 1 2 1 3   Moving slowly or fidgety/restless - 0 0 0 0  Suicidal thoughts - 0 0 0 0  PHQ-9 Score - 16 15 5 16   Difficult doing work/chores - Somewhat difficult - Extremely dIfficult Very difficult   GAD 7 : Generalized Anxiety Score 11/14/2019 04/15/2019 07/14/2017  Nervous, Anxious, on Edge 1 3 2   Control/stop worrying 2 3 2   Worry too much - different things 2 3 1   Trouble relaxing 2 3 3   Restless 2 3 1   Easily annoyed or irritable 0 0 0  Afraid - awful might happen - 0 2  Total GAD 7 Score - 15 11  Anxiety Difficulty - Extremely difficult Very difficult      Review of Systems  Constitutional: Negative for activity change, fatigue and fever.  HENT: Negative for congestion and rhinorrhea.   Respiratory: Negative for cough and shortness of breath.   Cardiovascular: Negative for chest pain and leg swelling.  Gastrointestinal: Negative for abdominal pain, diarrhea and nausea.  Genitourinary: Negative for dysuria and hematuria.  Neurological: Negative for weakness and headaches.  Psychiatric/Behavioral: Negative for agitation and behavioral problems.       Objective:   Physical Exam Vitals reviewed.  Constitutional:      Appearance: He is well-nourished.   Cardiovascular:     Rate and Rhythm: Normal rate and regular rhythm.     Heart sounds: Normal heart sounds. No murmur heard.   Pulmonary:     Effort: Pulmonary effort is normal.     Breath sounds: Normal breath sounds.  Musculoskeletal:        General: No edema.  Lymphadenopathy:     Cervical: No cervical adenopathy.  Neurological:     Mental Status: He is alert.  Psychiatric:        Behavior: Behavior normal.           Assessment & Plan:  Previously patient was scheduled for a sleep study I am having a hard time finding it I will have the nurses look into this  Significant fatigue tiredness low motivation I believe that the patient deals with chronic depression but in the past he has not done well with serotonin reuptake inhibitors and currently only tolerates a low-dose of trazodone to help him sleep  I do believe he would benefit from counseling on a regular basis and potentially a psychiatrist input he tried to get this locally and was unable to do so so therefore we will help set him up in [place] patient to follow-up in several months sooner if any problems

## 2020-01-25 ENCOUNTER — Other Ambulatory Visit: Payer: Self-pay | Admitting: *Deleted

## 2020-01-25 ENCOUNTER — Telehealth: Payer: Self-pay | Admitting: *Deleted

## 2020-01-25 DIAGNOSIS — H6123 Impacted cerumen, bilateral: Secondary | ICD-10-CM | POA: Diagnosis not present

## 2020-01-25 DIAGNOSIS — G4730 Sleep apnea, unspecified: Secondary | ICD-10-CM

## 2020-02-09 NOTE — Telephone Encounter (Signed)
Left message to return call to see if patient has been scheduled for sleep study

## 2020-02-14 DIAGNOSIS — H608X3 Other otitis externa, bilateral: Secondary | ICD-10-CM | POA: Diagnosis not present

## 2020-02-14 DIAGNOSIS — H6121 Impacted cerumen, right ear: Secondary | ICD-10-CM | POA: Diagnosis not present

## 2020-02-17 NOTE — Telephone Encounter (Signed)
Sent mychart message

## 2020-03-01 NOTE — Telephone Encounter (Signed)
I called [facility] because I do not see where pt is scheduled and was told they have left messages with pt to return call because they need his new insurance. I tried to call pt and had to leave a message.

## 2020-03-06 NOTE — Telephone Encounter (Signed)
Tried to call no answer. Voicemail is full

## 2020-04-09 DIAGNOSIS — H6121 Impacted cerumen, right ear: Secondary | ICD-10-CM | POA: Diagnosis not present

## 2020-04-17 DIAGNOSIS — Z1331 Encounter for screening for depression: Secondary | ICD-10-CM | POA: Diagnosis not present

## 2020-04-17 DIAGNOSIS — Z1389 Encounter for screening for other disorder: Secondary | ICD-10-CM | POA: Diagnosis not present

## 2020-04-17 DIAGNOSIS — Z681 Body mass index (BMI) 19 or less, adult: Secondary | ICD-10-CM | POA: Diagnosis not present

## 2020-04-17 DIAGNOSIS — R5383 Other fatigue: Secondary | ICD-10-CM | POA: Diagnosis not present

## 2020-04-17 DIAGNOSIS — R5381 Other malaise: Secondary | ICD-10-CM | POA: Diagnosis not present

## 2020-04-17 DIAGNOSIS — G933 Postviral fatigue syndrome: Secondary | ICD-10-CM | POA: Diagnosis not present

## 2020-05-17 DIAGNOSIS — H60331 Swimmer's ear, right ear: Secondary | ICD-10-CM | POA: Diagnosis not present

## 2020-05-17 DIAGNOSIS — H6123 Impacted cerumen, bilateral: Secondary | ICD-10-CM | POA: Diagnosis not present

## 2020-06-07 DIAGNOSIS — H60331 Swimmer's ear, right ear: Secondary | ICD-10-CM | POA: Diagnosis not present

## 2020-07-24 ENCOUNTER — Encounter: Payer: Self-pay | Admitting: Family Medicine

## 2020-07-24 ENCOUNTER — Ambulatory Visit: Payer: Medicare HMO | Admitting: Family Medicine

## 2020-07-24 DIAGNOSIS — H6123 Impacted cerumen, bilateral: Secondary | ICD-10-CM | POA: Diagnosis not present

## 2020-09-18 DIAGNOSIS — H60331 Swimmer's ear, right ear: Secondary | ICD-10-CM | POA: Diagnosis not present

## 2020-09-28 ENCOUNTER — Telehealth: Payer: Self-pay | Admitting: Family Medicine

## 2020-09-28 NOTE — Telephone Encounter (Signed)
  Left message for patient to call back and schedule Medicare Annual Wellness Visit (AWV) in office.   Offer Saturday Sept 10,2022 to do by phone or video 8-12  If unable, please get scheduled in the office for AWV,or virtually/telephone.  No hx of AWV eligible for AWVI as of 01/20/2009  Please schedule at anytime with [facility] Advisor.      40 Minutes appointment   Any questions, please call me at [phone]

## 2020-11-21 DIAGNOSIS — H524 Presbyopia: Secondary | ICD-10-CM | POA: Diagnosis not present

## 2020-11-22 ENCOUNTER — Encounter: Payer: Self-pay | Admitting: Family Medicine

## 2020-11-22 ENCOUNTER — Ambulatory Visit: Payer: Medicare HMO | Admitting: Family Medicine

## 2020-11-30 ENCOUNTER — Telehealth: Payer: Self-pay | Admitting: Family Medicine

## 2020-11-30 NOTE — Telephone Encounter (Signed)
°  Left message for patient to call back and schedule Medicare Annual Wellness Visit (AWV) in office.  ° °If unable to come into the office for AWV,  please offer to do virtually or by telephone. ° °No hx of AWV eligible for AWVI as of 01/20/2009 per [facility]  ° °Please schedule at anytime with [facility]urse Health Advisor.     ° °40 Minutes appointment  ° °Any questions, please call me at [phone]   °

## 2020-12-20 ENCOUNTER — Other Ambulatory Visit: Payer: Self-pay

## 2020-12-20 ENCOUNTER — Ambulatory Visit (INDEPENDENT_AMBULATORY_CARE_PROVIDER_SITE_OTHER): Payer: Medicare HMO | Admitting: Nurse Practitioner

## 2020-12-20 ENCOUNTER — Encounter: Payer: Self-pay | Admitting: Nurse Practitioner

## 2020-12-20 VITALS — BP 132/81 | HR 77 | Temp 97.9°F | Ht 70.0 in | Wt 235.0 lb

## 2020-12-20 DIAGNOSIS — R1032 Left lower quadrant pain: Secondary | ICD-10-CM

## 2020-12-20 NOTE — Progress Notes (Signed)
  Subjective:     Patient ID: Jeffrey Compton, male   DOB: [birth year], 40 y.o.   MRN: [id number]  HPI  Patient here for left sided abdominal pain. Patient concerned that he pulled his muscle while lifting wood at work. Patient describes pain a dull stretching pain that has been "on and off" x3 week. Patient denies that anything makes it worse or better.  Patient using Advil which is managing his pain. Patient denies fever or chills. No changes to bowel habits.   Review of Systems  Constitutional:  Negative for chills, fatigue and fever.  Gastrointestinal:  Positive for abdominal pain. Negative for abdominal distention, constipation, diarrhea, nausea and vomiting.  Genitourinary:  Negative for genital sores, penile pain, penile swelling, scrotal swelling and testicular pain.  Neurological:  Negative for weakness.       Objective:   Physical Exam Constitutional:      General: He is not in acute distress.    Appearance: Normal appearance. He is normal weight. He is not ill-appearing, toxic-appearing or diaphoretic.  Cardiovascular:     Rate and Rhythm: Normal rate and regular rhythm.     Pulses: Normal pulses.     Heart sounds: Normal heart sounds. No murmur heard. Pulmonary:     Effort: Pulmonary effort is normal. No respiratory distress.     Breath sounds: Normal breath sounds. No wheezing.  Abdominal:     General: Abdomen is flat. Bowel sounds are normal. There is no distension or abdominal bruit. There are no signs of injury.     Palpations: Abdomen is soft. There is no shifting dullness, fluid wave, hepatomegaly, splenomegaly, mass or pulsatile mass.     Tenderness: There is abdominal tenderness in the left lower quadrant. There is no guarding or rebound. Negative signs include Murphy's sign, Rovsing's sign, McBurney's sign, psoas sign and obturator sign.     Hernia: A hernia is present. There is no hernia in the umbilical area, ventral area, left inguinal area or right inguinal  area.       Comments: Left possible spigelian hernia  Musculoskeletal:        General: No swelling.  Skin:    General: Skin is warm.     Findings: No erythema or lesion.  Neurological:     General: No focal deficit present.     Mental Status: He is alert and oriented to person, place, and time.  Psychiatric:        Mood and Affect: Mood normal.        Behavior: Behavior normal.        Thought Content: Thought content normal.        Judgment: Judgment normal.       Assessment:     1. 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 - If develop fever, chills, or intense abd pain go the the ED.  - Follow up in 3 weeks

## 2021-02-01 ENCOUNTER — Ambulatory Visit (HOSPITAL_COMMUNITY): Payer: No Typology Code available for payment source

## 2021-02-14 ENCOUNTER — Other Ambulatory Visit (HOSPITAL_COMMUNITY): Payer: Self-pay | Admitting: Family

## 2021-02-14 DIAGNOSIS — S35516A Injury of unspecified iliac vein, initial encounter: Secondary | ICD-10-CM

## 2021-03-11 ENCOUNTER — Ambulatory Visit (INDEPENDENT_AMBULATORY_CARE_PROVIDER_SITE_OTHER): Payer: No Typology Code available for payment source | Admitting: Family Medicine

## 2021-03-11 ENCOUNTER — Telehealth: Payer: Self-pay

## 2021-03-11 ENCOUNTER — Other Ambulatory Visit: Payer: Self-pay

## 2021-03-11 DIAGNOSIS — U071 COVID-19: Secondary | ICD-10-CM | POA: Diagnosis not present

## 2021-03-11 MED ORDER — MOLNUPIRAVIR EUA 200MG CAPSULE: 4.0000 | ORAL_CAPSULE | Freq: Two times a day (BID) | ORAL | 0 refills | Status: AC

## 2021-03-11 NOTE — Telephone Encounter (Signed)
Pt called in stating that he tested positive for Covid and would like to know if he can get some meds call into to the pharmacy for symptoms. Pt would also like to receive a note for being out of work. Please advise.  Cb#: [phone]

## 2021-03-11 NOTE — Telephone Encounter (Signed)
Patient scheduled telephone visit today with Dr Lacinda Axon at 2:30 pm

## 2021-03-11 NOTE — Progress Notes (Signed)
Virtual Visit via Telephone Note  I connected with Jeffrey Compton on 03/11/21 at  2:30 PM EST by telephone and verified that I am speaking with the correct person using two identifiers.  Location: Patient: Jeffrey Compton (home). Provider: Thersa Salt DO, [facility]  I discussed the limitations, risks, security and privacy concerns of performing an evaluation and management service by telephone and the availability of in person appointments. I also discussed with the patient that there may be a patient responsible charge related to this service. The patient expressed understanding and agreed to proceed.  History of Present Illness: Patient reports that he has been sick since Saturday.  He states that he has had flulike symptoms.  He has had fatigue, body aches, and felt feverish.  He states that he is has recently been around a coworker who has been sick but his coworker tested negative for COVID-19.  Patient tested positive for COVID-19 via a home test yesterday.  He takes no prescription medications.  He has taken some over-the-counter medication for his symptoms without resolution.  Patient meets criteria for treatment for COVID-19 due to obesity.   Observations/Objective: Patient does not appear to be experiencing any shortness of breath.  He is speaking freely without difficulty.  Assessment and Plan: COVID-19 -patient tested positive for COVID-19 via home test.  Meets high risk criteria due to obesity.  We discussed watchful waiting and supportive care versus treatment with antiviral medication.  Patient preferred to start antiviral medication.  No recent labs.  Starting on molnupiravir.  Follow Up Instructions:   I discussed the assessment and treatment plan with the patient. The patient was provided an opportunity to ask questions and all were answered. The patient agreed with the plan and demonstrated an understanding of the instructions.   I provided 10 minutes of  non-face-to-face time during this encounter.   Jeffrey Spikes, DO

## 2021-03-13 ENCOUNTER — Encounter: Payer: Self-pay | Admitting: Family Medicine

## 2021-03-19 ENCOUNTER — Ambulatory Visit: Payer: Medicare HMO | Admitting: Internal Medicine

## 2021-03-27 ENCOUNTER — Other Ambulatory Visit: Payer: Self-pay

## 2021-03-27 ENCOUNTER — Ambulatory Visit (INDEPENDENT_AMBULATORY_CARE_PROVIDER_SITE_OTHER): Payer: No Typology Code available for payment source | Admitting: Family Medicine

## 2021-03-27 ENCOUNTER — Encounter: Payer: Self-pay | Admitting: Family Medicine

## 2021-03-27 ENCOUNTER — Encounter (HOSPITAL_COMMUNITY): Payer: Self-pay | Admitting: Family

## 2021-03-27 ENCOUNTER — Ambulatory Visit: Payer: BC Managed Care – PPO | Attending: Vascular Surgery | Admitting: Family

## 2021-03-27 ENCOUNTER — Inpatient Hospital Stay (HOSPITAL_BASED_OUTPATIENT_CLINIC_OR_DEPARTMENT_OTHER)
Admission: RE | Admit: 2021-03-27 | Discharge: 2021-03-27 | Disposition: A | Discharge status: Home / Self Care | Payer: BC Managed Care – PPO | Source: Ambulatory Visit

## 2021-03-27 VITALS — BP 162/78 | HR 75 | Temp 97.3°F

## 2021-03-27 DIAGNOSIS — R21 Rash and other nonspecific skin eruption: Secondary | ICD-10-CM

## 2021-03-27 DIAGNOSIS — S35516A Injury of unspecified iliac vein, initial encounter: Secondary | ICD-10-CM

## 2021-03-27 DIAGNOSIS — M7989 Other specified soft tissue disorders: Secondary | ICD-10-CM

## 2021-03-27 MED ORDER — PREDNISONE 10 MG PO TABS: ORAL_TABLET | ORAL | 0 refills | Status: DC

## 2021-03-27 NOTE — Patient Instructions (Signed)
Keep the dry areas moist/well hydrated. ? ?Medication as prescribed. ? ?Take care ? ?Dr. Lacinda Axon  ?

## 2021-03-27 NOTE — Progress Notes (Signed)
? ?Subjective:  ?Patient ID: Jeffrey Compton, male    DOB: [birth year]  Age: 41 y.o. MRN: [id number] ? ?CC: ?Chief Complaint  ?Patient presents with  ? Poison Ivy  ?  On hands and corners of eyes. Began on Monday.   ? ? ?HPI: ? ?41 year old male presents for evaluation of the above. ? ?Patient states that this started on Monday.  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  He states that he was in some "weeds".  He believes that he has come in contact with poison oak or poison ivy.  He states that it affects his hands and he also feels like it is starting to occur around his eyes.  No medications or interventions tried.  No relieving factors. ? ?Patient Active Problem List  ? Diagnosis Date Noted  ? Rash 03/27/2021  ? 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 ? ?Social Hx   ?Social History  ? ?Socioeconomic History  ? Marital status: Single  ?  Spouse name: Not on file  ? Number of children: Not on file  ? Years of education: Not on file  ? Highest education level: Not on file  ?Occupational History  ? Not on file  ?Tobacco Use  ? Smoking status: Never  ? Smokeless tobacco: Never  ?Substance and Sexual Activity  ? Alcohol use: Not Currently  ?  Comment: occasional  ? Drug use: No  ? Sexual activity: Not on file  ?Other Topics Concern  ? Not on file  ?Social History Narrative  ? Not on file  ? ?Social Determinants of Health  ? ?Financial Resource Strain: Not on file  ?Food Insecurity: Not on file  ?Transportation Needs: Not on file  ?Physical Activity: Not on file  ?Stress: Not on file  ?Social Connections: Not on file  ? ? ?Review of Systems ?Per HPI ? ?Objective:  ?BP 130/68   Pulse (!) 121   Temp 99.5 ?F (37.5 ?C)   Wt 232 lb (105.2 kg)   SpO2 99%   BMI 33.29 kg/m?  ? ?BP/Weight 03/27/2021 12/20/2020 01/24/2020  ?Systolic BP 973 532 992  ?Diastolic BP 68 81 84  ?Wt. (Lbs) 232 235 237  ?BMI 33.29 33.72 34.01  ? ? ?Physical Exam ?Constitutional:   ?   General: He is not in acute distress. ?HENT:  ?   Head: Normocephalic and atraumatic.   ?   Comments: No appreciable rash noted on the face. ?Pulmonary:  ?   Effort: Pulmonary effort is normal. No respiratory distress.  ?Skin: ?   Comments: Hands with scattered areas of erythematous papules.  Very dry hands with cracking noted directly at the hypothenar eminence.  ?Neurological:  ?   Mental Status: He is alert.  ? ? ?Lab Results  ?Component Value Date  ? WBC 5.4 02/01/2019  ? HGB 15.4 02/01/2019  ? HCT 45.8 02/01/2019  ? PLT 263 02/01/2019  ? GLUCOSE 96 02/22/2018  ? CHOL 218 (H) 02/22/2018  ? TRIG 256 (H) 02/22/2018  ? HDL 27 (L) 02/22/2018  ? LDLCALC 140 (H) 02/22/2018  ? ALT 40 02/17/2018  ? AST 36 02/17/2018  ? NA 139 02/22/2018  ? K 5.2 02/22/2018  ? CL 99 02/22/2018  ? CREATININE 1.06 02/22/2018  ? BUN 11 02/22/2018  ? CO2 25 02/22/2018  ? ? ? ?Assessment & Plan:  ? ?Problem List Items Addressed This Visit   ? ?  ? Musculoskeletal and Integument  ? Rash  ?  Patient concern for early poison oak/poison  ivy.  Has extremely dry skin on the hands.  Placing on prednisone.  Advised to keep hands moisturized. ?  ?  ? ? ?Meds ordered this encounter  ?Medications  ? predniSONE (DELTASONE) 10 MG tablet  ?  Sig: 50 mg daily x 2 days, then 40 mg daily x 2 days, then 30 mg daily x 2 days, then 20 mg daily x 2 days, then 10 mg daily x 2 days.  ?  Dispense:  30 tablet  ?  Refill:  0  ? ?Thersa Salt DO ?[facility] ? ?

## 2021-03-27 NOTE — Assessment & Plan Note (Signed)
Patient concern for early poison oak/poison ivy.  Has extremely dry skin on the hands.  Placing on prednisone.  Advised to keep hands moisturized. ?

## 2021-03-27 NOTE — Progress Notes (Cosign Needed Addendum)
Vascular Surgery  New Patient Clinic Note       Date: 03/27/2021  Patient: Arthur Stevenson  MRN: [id number]  DOB: May 02, 1980  PCP: Arthur Bruce, NP    Chief Complaint:   Chief Complaint   Patient presents with   . Establish Care     Iliac vein compression       Subjective:     HPI: Arthur Stevenson is a 41 y.o. White male who presents for evaluation of iliac vein compression. 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     At today's visit, the patient reports having bilateral lower extremity swelling that has been present since he was a child that improves with elevation. He reports he also will experience slow to heal wounds. He is currently on antibiotics for LLE cellulitis with his first episode occurring 2 years ago. He is unable to tolerate compression stockings as they are "uncomfortable". He denies any history of DVT, hypercoagulability, or malignancy. His current occupation is a Forensic scientist which requires prolonged standing, with approximately 10h of standing. He reports he is actively attempting weight loss. He is a current smoker of 1PPD for approximately 15 years, but has been attempting cessation. He has no other complaints at this time.     PMH:   History reviewed. No pertinent past medical history.      Family Hx:  Family Medical History:     Problem Relation (Age of Onset)    Cancer Maternal Grandmother, Paternal Grandmother            Medications:  Current Outpatient Medications   Medication Sig Dispense Refill   . clindamycin (CLEOCIN) 300 mg Oral Capsule Take 1 Capsule (300 mg total) by mouth Four times a day     . furosemide (LASIX) 40 mg Oral Tablet Take 1 Tablet (40 mg total) by mouth Once a day     . trimethoprim-sulfamethoxazole (BACTRIM) 80-400mg  per tablet Take 1 Tablet (80 mg total) by mouth Twice daily Patient wasn't sure of exact med or dose       No current facility-administered  medications for this visit.       Cardiovascular Medications:  ASA:  No  Statin:  No  Antiplatelets (Plavix, Brilinta, Effient):  No  Anticoagulant:No  Spironolactone Indicated (Heart Failure):  No    Allergies:  No Known Allergies    Past Surgical Hx:  History reviewed. No pertinent surgical history.        Social Hx:  Social History     Socioeconomic History   . Marital status: Unknown     Spouse name: Not on file   . Number of children: Not on file   . Years of education: Not on file   . Highest education level: Not on file   Occupational History   . Not on file   Tobacco Use   . Smoking status: Every Day     Packs/day: 1.00     Years: 15.00     Pack years: 15.00     Types: Cigarettes     Start date: 2005   . Smokeless tobacco: Former     Types: Snuff   Substance and Sexual Activity   . Alcohol use: Never   . Drug use: Never   . Sexual activity: Not on file   Other Topics Concern   . Not on file   Social History Narrative   .  Not on file     Social Determinants of Health     Financial Resource Strain: Not on file   Transportation Needs: Not on file   Social Connections: Not on file   Intimate Partner Violence: Not on file   Housing Stability: Not on file       Review Of Systems:  General: Denies fevers, chills, sweats or fatigue  Eyes: Denies blurred vision or Stevenson defects  HEENT: Denies hearing loss  Respiratory: Denies cough, or shortness of breath  Cardiovascular: Denies chest pain or claudication  Gastrointestinal: Denies postprandial pain, nausea, vomiting, or problems with bowel movements.   Genitourinary: Denies dysuria, frequency, or trouble urinating   Musculoskeletal: As in HPI  Neurological: negative for unilateral weakness or monocular blindness  Integumentary: +LLE cellulitis  All other systems were negative     Objective:  Physical Exam:   Vitals: BP (!) 162/78 Comment: LA, 129/69  Pulse 75   Temp 36.3 C (97.3 F)   SpO2 95%       Constitutional: AA&O X3 Well developed and well nourished in no  acute distress   HENT: Head is normocephalic, atraumatic   Eyes: Conjunctiva clear, Pupils equal, round and reactive to light; Sclera without icterus  Neck: Normal ROM, Supple, symmetrical  Respiratory: Effort normal  Cardiovascular: Heart regular rate and rhythm  Extremities: BLE edema, L>R  Integumentary:  On L shin there is erythema with an ulceration (see image)  Neurologic: Grossly normal, no focal neuro deficit, normal coordination and gait  Psychiatric: normal affect, behavior, memory, thought content, judgement, and speech.                  Vascular Exam:     Left Right   Dorsalis pedis artery: 2+ (normal)  Posterior tibial artery: 2+ (normal) Dorsalis pedis artery: 2+ (normal)  Posterior tibial artery: 2+ (normal)     Imaging Tests:  Radiology/imaging results were reviewed as below.   LLE PERIPHERAL VENOUS DUPLEX: 03/27/21  Final read pending. My personal interpretation:  **      CT LLE: 01/11/19      CTA BLE: 01/11/19      Risk Factors and Comorbid Conditions:  History of previous carotid artery surgery and/or stenting: No  Peripheral Vascular Disease: Atherosclerosis of the extremities: No  With Gangrene: no. Intermittent claudication: no. Rest pain: no.  Wound /Ulcer present: No  Renal Failure/Insuficiency: No  Heart Failure: no   Diabetes Mellitus  (HbA1C >6.5, Fasting >126 or Random glucose >200 with hyperglycemic symptoms):  No  DVT  No    Assessment  1. L common iliac vein compression    Plan:  Arthur Stevenson is a 41 y.o. male who presents for initial evaluation of iliac vein compression. Ordered peripheral venous duplex done today and discussed personal interpretation with the patient is negative for DVT.  Will refer to lymphedema/compression clinic. Recommend medical grade compression x3 months. Will follow up in vascular clinic in 3 months with venous insufficiency testing.     Defer management of LLE cellulitis and wound management to PCP (currently on antibiotics ).     Recommend patient follow  with PCP for further management of BP.     Recommend patient wear compression. Will place referral to Arthur Stevenson, in Lymphedema clinic for further management and possible compression pumps.    We will plan to continue surveillance and we will see the patient back in the office in  43months.  Patient was given the opportunity  to ask questions and those questions were answered to their satisfaction. Instructed to call with any further questions or concerns.     I am scribing for, and in the presence of, Narda RutherfordNicole Dejean Tribby, APRN, NP-C for services provided on 03/27/2021.  Angela NevinKeleigh Cochran, SCRIBE     Angela NevinKeleigh Cochran, SCRIBE  03/27/2021, 10:39      I personally performed the services described in this documentation, as scribed  in my presence, and it is both accurate  and complete.    Narda RutherfordNicole Eliodoro Gullett, APRN,NP-C

## 2021-04-01 DIAGNOSIS — M7989 Other specified soft tissue disorders: Secondary | ICD-10-CM

## 2021-04-30 ENCOUNTER — Ambulatory Visit (INDEPENDENT_AMBULATORY_CARE_PROVIDER_SITE_OTHER): Payer: No Typology Code available for payment source

## 2021-04-30 VITALS — Ht 70.0 in | Wt 232.0 lb

## 2021-04-30 DIAGNOSIS — Z0000 Encounter for general adult medical examination without abnormal findings: Secondary | ICD-10-CM | POA: Diagnosis not present

## 2021-04-30 NOTE — Progress Notes (Signed)
? ?Subjective:  ? Jeffrey Compton is a 41 y.o. male who presents for an Initial Medicare Annual Wellness Visit. ?Virtual Visit via Telephone Note ? ?I connected with  Jeffrey Compton on 04/30/21 at  2:15 PM EDT by telephone and verified that I am speaking with the correct person using two identifiers. ? ?Location: ?Patient: HOME ?Provider: [facility] ?Persons participating in the virtual visit: patient/Nurse Health Advisor ?  ?I discussed the limitations, risks, security and privacy concerns of performing an evaluation and management service by telephone and the availability of in person appointments. The patient expressed understanding and agreed to proceed. ? ?Interactive audio and video telecommunications were attempted between this nurse and patient, however failed, due to patient having technical difficulties OR patient did not have access to video capability.  We continued and completed visit with audio only. ? ?Some vital signs may be absent or patient reported.  ? ?Chriss Driver, LPN ? ?Review of Systems    ? ?Cardiac Risk Factors include: male gender;sedentary lifestyle;obesity (BMI >30kg/m2);Other (see comment), Risk factor comments: SOCIAL PHOBIA ? ?   ?Objective:  ?  ?Today's Vitals  ? 04/30/21 1428  ?Weight: 232 lb (105.2 kg)  ?Height: '5\' 10"'$  (1.778 m)  ? ?Body mass index is 33.29 kg/m?. ? ? ?  04/30/2021  ?  2:33 PM 03/12/2018  ?  9:51 AM 02/17/2018  ?  4:02 PM 04/25/2015  ?  1:50 PM  ?Advanced Directives  ?Does Patient Have a Medical Advance Directive? No No No No  ?Would patient like information on creating a medical advance directive? No - Patient declined No - Patient declined No - Patient declined   ? ? ?Current Medications (verified) ?Outpatient Encounter Medications as of 04/30/2021  ?Medication Sig  ? predniSONE (DELTASONE) 10 MG tablet 50 mg daily x 2 days, then 40 mg daily x 2 days, then 30 mg daily x 2 days, then 20 mg daily x 2 days, then 10 mg daily x 2 days. (Patient not taking: Reported  on 04/30/2021)  ? ?No facility-administered encounter medications on file as of 04/30/2021.  ? ? ?Allergies (verified) ?Celexa [citalopram hydrobromide] and Keflex [cephalexin]  ? ?History: ?Past Medical History:  ?Diagnosis Date  ? Asperger syndrome   ? Hyperlipidemia   ? OCD (obsessive compulsive disorder)   ? ?No past surgical history on file. ?Family History  ?Problem Relation Age of Onset  ? Heart disease Other   ? ?Social History  ? ?Socioeconomic History  ? Marital status: Single  ?  Spouse name: Not on file  ? Number of children: Not on file  ? Years of education: Not on file  ? Highest education level: Not on file  ?Occupational History  ? Not on file  ?Tobacco Use  ? Smoking status: Never  ? Smokeless tobacco: Never  ?Substance and Sexual Activity  ? Alcohol use: Not Currently  ?  Comment: occasional  ? Drug use: No  ? Sexual activity: Not on file  ?Other Topics Concern  ? Not on file  ?Social History Narrative  ? Not on file  ? ?Social Determinants of Health  ? ?Financial Resource Strain: Low Risk   ? Difficulty of Paying Living Expenses: Not hard at all  ?Food Insecurity: No Food Insecurity  ? Worried About Charity fundraiser in the Last Year: Never true  ? Ran Out of Food in the Last Year: Never true  ?Transportation Needs: No Transportation Needs  ? Lack of Transportation (Medical):  No  ? Lack of Transportation (Non-Medical): No  ?Physical Activity: Sufficiently Active  ? Days of Exercise per Week: 5 days  ? Minutes of Exercise per Session: 30 min  ?Stress: Stress Concern Present  ? Feeling of Stress : To some extent  ?Social Connections: Socially Isolated  ? Frequency of Communication with Friends and Family: More than three times a week  ? Frequency of Social Gatherings with Friends and Family: More than three times a week  ? Attends Religious Services: Never  ? Active Member of Clubs or Organizations: No  ? Attends Archivist Meetings: Never  ? Marital Status: Never married  ? ? ?Tobacco  Counseling ?Counseling given: Not Answered ? ? ?Clinical Intake: ? ?Pre-visit preparation completed: Yes ? ?Pain : No/denies pain ? ?  ? ?BMI - recorded: 33.29 ?Nutritional Status: BMI > 30  Obese ?Nutritional Risks: None ?Diabetes: No ? ?How often do you need to have someone help you when you read instructions, pamphlets, or other written materials from your doctor or pharmacy?: 1 - Never ? ?Diabetic?no ? ?Interpreter Needed?: No ? ?Information entered by :: mj Lear Carstens, lpn ? ? ?Activities of Daily Living ? ?  04/30/2021  ?  2:35 PM  ?In your present state of health, do you have any difficulty performing the following activities:  ?Hearing? 0  ?Vision? 0  ?Difficulty concentrating or making decisions? 1  ?Comment at times with memory per pt  ?Walking or climbing stairs? 0  ?Dressing or bathing? 0  ?Doing errands, shopping? 0  ?Preparing Food and eating ? N  ?Using the Toilet? N  ?In the past six months, have you accidently leaked urine? N  ?Do you have problems with loss of bowel control? N  ?Managing your Medications? N  ?Managing your Finances? N  ?Housekeeping or managing your Housekeeping? N  ? ? ?Patient Care Team: ?Kathyrn Drown, MD as PCP - General (Family Medicine) ? ?Indicate any recent Medical Services you may have received from other than [facility] providers in the past year (date may be approximate). ? ?   ?Assessment:  ? This is a routine wellness examination for Jeffrey Compton. ? ?Hearing/Vision screen ?Hearing Screening - Comments:: No hearing issues.  ?Vision Screening - Comments:: Glasses. Dr. Liane Comber. 2022. ? ?Dietary issues and exercise activities discussed: ?Current Exercise Habits: The patient has a physically strenuous job, but has no regular exercise apart from work., Exercise limited by: psychological condition(s) ? ? Goals Addressed   ? ?  ?  ?  ?  ? This Visit's Progress  ?  Weight (lb) < 200 lb (90.7 kg)   232 lb (105.2 kg)  ?  Would like to lose 30# ?  ? ?  ? ?Depression Screen ? ?   04/30/2021  ?  2:31 PM 12/20/2020  ?  9:00 AM 01/24/2020  ?  1:07 PM 11/14/2019  ?  7:36 PM 11/14/2019  ?  2:21 PM 04/15/2019  ? 10:43 AM 07/14/2017  ?  9:02 AM  ?PHQ 2/9 Scores  ?PHQ - 2 Score 0 0 0 5 1 0 5  ?PHQ- 9 Score    '16 15 5 16  '$ ?  ?Fall Risk ? ?  04/30/2021  ?  2:34 PM 12/20/2020  ?  9:00 AM  ?Fall Risk   ?Falls in the past year? 0 0  ?Number falls in past yr: 0 0  ?Injury with Fall? 0 0  ?Risk for fall due to : No Fall Risks No Fall  Risks  ?Follow up Falls prevention discussed Falls evaluation completed  ? ? ?FALL RISK PREVENTION PERTAINING TO THE HOME: ? ?Any stairs in or around the home? No  ?If so, are there any without handrails? No  ?Home free of loose throw rugs in walkways, pet beds, electrical cords, etc? Yes  ?Adequate lighting in your home to reduce risk of falls? Yes  ? ?ASSISTIVE DEVICES UTILIZED TO PREVENT FALLS: ? ?Life alert? No  ?Use of a cane, walker or w/c? No  ?Grab bars in the bathroom? No  ?Shower chair or bench in shower? No  ?Elevated toilet seat or a handicapped toilet? No  ? ?TIMED UP AND GO: ? ?Was the test performed? No .  ?Phone visit.  ? ?Cognitive Function: ?  ?  ? ?  04/30/2021  ?  2:35 PM  ?6CIT Screen  ?What Year? 0 points  ?What month? 0 points  ?What time? 0 points  ?Count back from 20 0 points  ?Months in reverse 0 points  ?Repeat phrase 2 points  ?Total Score 2 points  ? ? ?Immunizations ?Immunization History  ?Administered Date(s) Administered  ? Moderna Sars-Covid-2 Vaccination 08/01/2019, 08/22/2019  ? Tdap 04/25/2015  ? ? ?TDAP status: Up to date ? ?Flu Vaccine status: Declined, Education has been provided regarding the importance of this vaccine but patient still declined. Advised may receive this vaccine at local pharmacy or Health Dept. Aware to provide a copy of the vaccination record if obtained from local pharmacy or Health Dept. Verbalized acceptance and understanding. ? ?Pneumococcal vaccine status: Due, Education has been provided regarding the importance of this  vaccine. Advised may receive this vaccine at local pharmacy or Health Dept. Aware to provide a copy of the vaccination record if obtained from local pharmacy or Health Dept. Verbalized acceptance and [place]

## 2021-04-30 NOTE — Patient Instructions (Signed)
Jeffrey Compton,  ?Thank you for taking time to come for your Medicare Wellness Visit. I appreciate your ongoing commitment to your health goals. Please review the following plan we discussed and let me know if I can assist you in the future.  ? ?Screening recommendations/referrals: ?Colonoscopy: NOT DUE UNTIL AGE 39. ? ?Recommended yearly ophthalmology/optometry visit for glaucoma screening and checkup ?Recommended yearly dental visit for hygiene and checkup ? ?Vaccinations: ?Influenza vaccine: DECLINED. ?Pneumococcal vaccine: NOT DUE UNTIL AGE 34. ?Tdap vaccine: DONE 04/25/2015 Repeat in 10 years ? ?Shingles vaccine: DUE AT AGE 59.   ?Covid-19: DONE 08/01/2019 AND 08/22/2019. ? ?Advanced directives: Advance directive discussed with you today. Even though you declined this today, please call our office should you change your mind, and we can give you the proper paperwork for you to fill out. ? ? ?Conditions/risks identified: Aim for 30 minutes of exercise or brisk walking, 6-8 glasses of water, and 5 servings of fruits and vegetables each day. ? ? ?Next appointment: Follow up in one year for your annual wellness visit 2024. ? ?Preventive Care 52-48 Years Old, Male ?Preventive care refers to lifestyle choices and visits with your health care provider that can promote health and wellness. Preventive care visits are also called wellness exams. ?What can I expect for my preventive care visit? ?Counseling ?During your preventive care visit, your health care provider may ask about your: ?Medical history, including: ?Past medical problems. ?Family medical history. ?Current health, including: ?Emotional well-being. ?Home life and relationship well-being. ?Sexual activity. ?Lifestyle, including: ?Alcohol, nicotine or tobacco, and drug use. ?Access to firearms. ?Diet, exercise, and sleep habits. ?Safety issues such as seatbelt and bike helmet use. ?Sunscreen use. ?Work and work Statistician. ?Physical exam ?Your health care provider  may check your: ?Height and weight. These may be used to calculate your BMI (body mass index). BMI is a measurement that tells if you are at a healthy weight. ?Waist circumference. This measures the distance around your waistline. This measurement also tells if you are at a healthy weight and may help predict your risk of certain diseases, such as type 2 diabetes and high blood pressure. ?Heart rate and blood pressure. ?Body temperature. ?Skin for abnormal spots. ?What immunizations do I need? ?Vaccines are usually given at various ages, according to a schedule. Your health care provider will recommend vaccines for you based on your age, medical history, and lifestyle or other factors, such as travel or where you work. ?What tests do I need? ?Screening ?Your health care provider may recommend screening tests for certain conditions. This may include: ?Lipid and cholesterol levels. ?Diabetes screening. This is done by checking your blood sugar (glucose) after you have not eaten for a while (fasting). ?Hepatitis B test. ?Hepatitis C test. ?HIV (human immunodeficiency virus) test. ?STI (sexually transmitted infection) testing, if you are at risk. ?Talk with your health care provider about your test results, treatment options, and if necessary, the need for more tests. ?Follow these instructions at home: ?Eating and drinking ? ?Eat a healthy diet that includes fresh fruits and vegetables, whole grains, lean protein, and low-fat dairy products. ?Drink enough fluid to keep your urine pale yellow. ?Take vitamin and mineral supplements as recommended by your health care provider. ?Do not drink alcohol if your health care provider tells you not to drink. ?If you drink alcohol: ?Limit how much you have to 0-2 drinks a day. ?Know how much alcohol is in your drink. In the [place] one drink equals one 12 oz  bottle of beer (355 mL), one 5 oz glass of wine (148 mL), or one 1? oz glass of hard liquor (44 mL). ?Lifestyle ?Brush your  teeth every morning and night with fluoride toothpaste. Floss one time each day. ?Exercise for at least 30 minutes 5 or more days each week. ?Do not use any products that contain nicotine or tobacco. These products include cigarettes, chewing tobacco, and vaping devices, such as e-cigarettes. If you need help quitting, ask your health care provider. ?Do not use drugs. ?If you are sexually active, practice safe sex. Use a condom or other form of protection to prevent STIs. ?Find healthy ways to manage stress, such as: ?Meditation, yoga, or listening to music. ?Journaling. ?Talking to a trusted person. ?Spending time with friends and family. ?Minimize exposure to UV radiation to reduce your risk of skin cancer. ?Safety ?Always wear your seat belt while driving or riding in a vehicle. ?Do not drive: ?If you have been drinking alcohol. Do not ride with someone who has been drinking. ?If you have been using any mind-altering substances or drugs. ?While texting. ?When you are tired or distracted. ?Wear a helmet and other protective equipment during sports activities. ?If you have firearms in your house, make sure you follow all gun safety procedures. ?Seek help if you have been physically or sexually abused. ?What's next? ?Go to your health care provider once a year for an annual wellness visit. ?Ask your health care provider how often you should have your eyes and teeth checked. ?Stay up to date on all vaccines. ?This information is not intended to replace advice given to you by your health care provider. Make sure you discuss any questions you have with your health care provider. ?Document Revised: 07/04/2020 Document Reviewed: 07/04/2020 ?Elsevier Patient Education ? [place]. ? ?

## 2021-05-01 ENCOUNTER — Ambulatory Visit
Admission: EM | Admit: 2021-05-01 | Discharge: 2021-05-01 | Disposition: A | Discharge status: Home / Self Care | Payer: No Typology Code available for payment source | Attending: Family Medicine | Admitting: Family Medicine

## 2021-05-01 ENCOUNTER — Encounter: Payer: Self-pay | Admitting: Emergency Medicine

## 2021-05-01 DIAGNOSIS — H66003 Acute suppurative otitis media without spontaneous rupture of ear drum, bilateral: Secondary | ICD-10-CM

## 2021-05-01 MED ORDER — FLUTICASONE PROPIONATE 50 MCG/ACT NA SUSP: 1.0000 | Freq: Two times a day (BID) | NASAL | 2 refills | Status: DC

## 2021-05-01 MED ORDER — AZITHROMYCIN 250 MG PO TABS: ORAL_TABLET | ORAL | 0 refills | Status: DC

## 2021-05-01 NOTE — ED Triage Notes (Signed)
Bilateral ear pain since Friday ?

## 2021-05-01 NOTE — ED Provider Notes (Signed)
?[facility] ? ? ? ?CSN: [id number] ?Arrival date & time: 05/01/21  0808 ? ? ?  ? ?History   ?Chief Complaint ?Chief Complaint  ?Patient presents with  ? Otalgia  ? ? ?HPI ?Jeffrey Compton is a 41 y.o. male.  ? ?Presenting today with 5-day history of progressively worsening bilateral ear pain and pressure, muffled hearing, nasal congestion and sinus pain and pressure.  Denies fever, chills, drainage from the ear, chest pain, shortness of breath.  Trying Tylenol with no relief.  No new sick contacts recently.  No known history of seasonal allergies that he is aware of. ? ? ?Past Medical History:  ?Diagnosis Date  ? Asperger syndrome   ? Hyperlipidemia   ? OCD (obsessive compulsive disorder)   ? ? ?Patient Active Problem List  ? Diagnosis Date Noted  ? Rash 03/27/2021  ? Acute swimmer's ear of right side 02/08/2019  ? Ear itch 09/28/2018  ? Impacted cerumen of right ear 09/28/2018  ? 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 Multiple benign nevi 12/15/2011  ? Seborrheic dermatitis 12/15/2011  ? ? ?History reviewed. No pertinent surgical history. ? ? ? ? ?Home Medications   ? ?Prior to Admission medications   ?Medication Sig Start Date End Date Taking? Authorizing Provider  ?azithromycin (ZITHROMAX) 250 MG tablet Take first 2 tablets together, then 1 every day until finished. 05/01/21  Yes Volney American, PA-C  ?fluticasone (FLONASE) 50 MCG/ACT nasal spray Place 1 spray into both nostrils 2 (two) times daily. 05/01/21  Yes Volney American, PA-C  ?predniSONE (DELTASONE) 10 MG tablet 50 mg daily x 2 days, then 40 mg daily x 2 days, then 30 mg daily x 2 days, then 20 mg daily x 2 days, then 10 mg daily x 2 days. ?Patient not taking: Reported on 04/30/2021 03/27/21   Coral Spikes, DO  ? ? ?Family History ?Family History  ?Problem Relation Age of Onset  ? Heart disease Other   ? ? ?Social History ?Social History  ? ?Tobacco Use  ? Smoking status: Never  ? Smokeless tobacco: Never  ?Vaping Use  ? Vaping Use: Never  used  ?Substance Use Topics  ? Alcohol use: Not Currently  ?  Comment: occasional  ? Drug use: No  ? ? ? ?Allergies   ?Celexa [citalopram hydrobromide] and Keflex [cephalexin] ? ? ?Review of Systems ?Review of Systems ?Per HPI ? ?Physical Exam ?Triage Vital Signs ?ED Triage Vitals [05/01/21 0816]  ?Enc Vitals Group  ?   BP (!) 145/84  ?   Pulse Rate (!) 102  ?   Resp 18  ?   Temp 98.3 ?F (36.8 ?C)  ?   Temp Source Oral  ?   SpO2 98 %  ?   Weight   ?   Height   ?   Head Circumference   ?   Peak Flow   ?   Pain Score 5  ?   Pain Loc   ?   Pain Edu?   ?   Excl. in [place]?   ? ?No data found. ? ?Updated Vital Signs ?BP (!) 145/84 (BP Location: Right Arm)   Pulse (!) 102   Temp 98.3 ?F (36.8 ?C) (Oral)   Resp 18   SpO2 98%  ? ?Visual Acuity ?Right Eye Distance:   ?Left Eye Distance:   ?Bilateral Distance:   ? ?Right Eye Near:   ?Left Eye Near:    ?Bilateral Near:    ? ?Physical  Exam ?Vitals and nursing note reviewed.  ?Constitutional:   ?   Appearance: Normal appearance.  ?HENT:  ?   Head: Atraumatic.  ?   Ears:  ?   Comments: Bilateral ear canals edematous with flaking, bilateral TMs erythematous, edematous ?   Nose: Rhinorrhea present.  ?   Mouth/Throat:  ?   Mouth: Mucous membranes are moist.  ?Eyes:  ?   Extraocular Movements: Extraocular movements intact.  ?   Conjunctiva/sclera: Conjunctivae normal.  ?Cardiovascular:  ?   Rate and Rhythm: Normal rate and regular rhythm.  ?Pulmonary:  ?   Effort: Pulmonary effort is normal.  ?   Breath sounds: Normal breath sounds.  ?Musculoskeletal:     ?   General: Normal range of motion.  ?   Cervical back: Normal range of motion and neck supple.  ?Skin: ?   General: Skin is warm and dry.  ?Neurological:  ?   General: No focal deficit present.  ?   Mental Status: He is oriented to person, place, and time.  ?   Motor: No weakness.  ?   Gait: Gait normal.  ?Psychiatric:     ?   Mood and Affect: Mood normal.     ?   Thought Content: Thought content normal.     ?   Judgment: Judgment  normal.  ? ? ? ?UC Treatments / Results  ?Labs ?(all labs ordered are listed, but only abnormal results are displayed) ?Labs Reviewed - No data to display ? ?EKG ? ? ?Radiology ?No results found. ? ?Procedures ?Procedures (including critical care time) ? ?Medications Ordered in UC ?Medications - No data to display ? ?Initial Impression / Assessment and Plan / UC Course  ?I have reviewed the triage vital signs and the nursing notes. ? ?Pertinent labs & imaging results that were available during my care of the patient were reviewed by me and considered in my medical decision making (see chart for details). ? ?  ? ?Treat with azithromycin, Flonase, over-the-counter pain relievers.  Return for worsening symptoms. ? ?Final Clinical Impressions(s) / UC Diagnoses  ? ?Final diagnoses:  ?Acute suppurative otitis media of both ears without spontaneous rupture of tympanic membranes, recurrence not specified  ? ? ? ?Discharge Instructions   ? ?  ?Try coricidin HBP for sinus pressure and ear pressure ? ? ? ?ED Prescriptions   ? ? Medication Sig Dispense Auth. Provider  ? azithromycin (ZITHROMAX) 250 MG tablet Take first 2 tablets together, then 1 every day until finished. 6 tablet Volney American, [place]  ? fluticasone [facility]) 50 MCG/ACT nasal spray Place 1 spray into both nostrils 2 (two) times daily. 16 g Volney American, [place]  ? ?  ? ?PDMP not reviewed this encounter. ?  ?Volney American, PA-C ?05/01/21 1038 ? ?

## 2021-05-01 NOTE — Discharge Instructions (Addendum)
Try coricidin HBP for sinus pressure and ear pressure ?

## 2021-05-06 ENCOUNTER — Encounter: Payer: Self-pay | Admitting: Family Medicine

## 2021-05-06 ENCOUNTER — Ambulatory Visit: Payer: No Typology Code available for payment source | Admitting: Nurse Practitioner

## 2021-05-21 DIAGNOSIS — H938X3 Other specified disorders of ear, bilateral: Secondary | ICD-10-CM | POA: Diagnosis not present

## 2021-05-21 DIAGNOSIS — H6121 Impacted cerumen, right ear: Secondary | ICD-10-CM | POA: Diagnosis not present

## 2021-05-21 DIAGNOSIS — L299 Pruritus, unspecified: Secondary | ICD-10-CM | POA: Diagnosis not present

## 2021-06-21 DIAGNOSIS — H60391 Other infective otitis externa, right ear: Secondary | ICD-10-CM | POA: Diagnosis not present

## 2021-06-21 DIAGNOSIS — H6122 Impacted cerumen, left ear: Secondary | ICD-10-CM | POA: Diagnosis not present

## 2021-09-24 DIAGNOSIS — H61301 Acquired stenosis of right external ear canal, unspecified: Secondary | ICD-10-CM | POA: Diagnosis not present

## 2021-09-24 DIAGNOSIS — H900 Conductive hearing loss, bilateral: Secondary | ICD-10-CM | POA: Diagnosis not present

## 2021-09-29 ENCOUNTER — Ambulatory Visit
Admission: EM | Admit: 2021-09-29 | Discharge: 2021-09-29 | Disposition: A | Discharge status: Home / Self Care | Payer: No Typology Code available for payment source | Attending: Physician Assistant | Admitting: Physician Assistant

## 2021-09-29 ENCOUNTER — Encounter: Payer: Self-pay | Admitting: Emergency Medicine

## 2021-09-29 DIAGNOSIS — H6121 Impacted cerumen, right ear: Secondary | ICD-10-CM

## 2021-09-29 DIAGNOSIS — H60391 Other infective otitis externa, right ear: Secondary | ICD-10-CM

## 2021-09-29 MED ORDER — OFLOXACIN 0.3 % OT SOLN: 5.0000 [drp] | Freq: Two times a day (BID) | OTIC | 0 refills | Status: DC

## 2021-09-29 NOTE — ED Triage Notes (Signed)
Patient states that he has part of a q-tip stuck in his right ear since this morning.

## 2021-09-29 NOTE — Discharge Instructions (Signed)
They were able to rinse the wax out of your ear.  Please use ofloxacin drops twice daily.  Keep ear facing upward for several minutes after applying medication to allow to go complete the ear canal.  Do not put anything in your ear including ear buds, earplugs, Q-tips.  Use Tylenol for pain.  Follow-up with your ENT provider if symptoms not completely resolved.  If anything worsens return for reevaluation.

## 2021-09-29 NOTE — ED Provider Notes (Signed)
[facility]    CSN: [id number] Arrival date & time: 09/29/21  0854      History   Chief Complaint Chief Complaint  Patient presents with   Foreign Body in Ear    HPI Jeffrey Compton is a 41 y.o. male.   Patient presents today with a 1 day history of irritation and difficulty hearing in his right ear.  Reports he was cleaning out his ear with a Q-tip and he is concerned that there might be part of a Q-tip still stuck in his ear.  He denies any significant pain but does report that he has muffled hearing on that side.  He denies any recent illness or additional symptoms including fever, cough, congestion.  He has not tried any over-the-counter medication for symptom management.  He does not use earbuds, earplugs, Q-tips on a regular basis.  Does have history of recurrent cerumen impaction.    Past Medical History:  Diagnosis Date   Asperger syndrome    Hyperlipidemia    OCD (obsessive compulsive disorder)     Patient Active Problem List   Diagnosis Date Noted   Rash 03/27/2021   Acute swimmer's ear of right side 02/08/2019   Ear itch 09/28/2018   Impacted cerumen of right ear 09/28/2018   Social phobia 08/04/2016   Multiple benign nevi 12/15/2011   Seborrheic dermatitis 12/15/2011    History reviewed. No pertinent surgical history.     Home Medications    Prior to Admission medications   Medication Sig Start Date End Date Taking? Authorizing Provider  ofloxacin (FLOXIN) 0.3 % OTIC solution Place 5 drops into the right ear 2 (two) times daily. 09/29/21  Yes Genevie Elman, Derry Skill, PA-C    Family History Family History  Problem Relation Age of Onset   Heart disease Other     Social History Social History   Tobacco Use   Smoking status: Never   Smokeless tobacco: Never  Vaping Use   Vaping Use: Never used  Substance Use Topics   Alcohol use: Not Currently    Comment: occasional   Drug use: No     Allergies   Celexa [citalopram hydrobromide]  and Keflex [cephalexin]   Review of Systems Review of Systems  Constitutional:  Positive for activity change. Negative for appetite change, fatigue and fever.  HENT:  Positive for hearing loss. Negative for congestion, ear discharge, ear pain, sinus pressure, sneezing and sore throat.   Respiratory:  Negative for cough and shortness of breath.   Cardiovascular:  Negative for chest pain.  Gastrointestinal:  Negative for abdominal pain, diarrhea, nausea and vomiting.  Neurological:  Negative for dizziness, light-headedness and headaches.     Physical Exam Triage Vital Signs ED Triage Vitals  Enc Vitals Group     BP 09/29/21 0956 111/76     Pulse Rate 09/29/21 0956 79     Resp 09/29/21 0956 18     Temp 09/29/21 0956 98.8 F (37.1 C)     Temp Source 09/29/21 0956 Oral     SpO2 09/29/21 0956 98 %     Weight 09/29/21 0958 220 lb (99.8 kg)     Height 09/29/21 0958 '5\' 10"'$  (1.778 m)     Head Circumference --      Peak Flow --      Pain Score 09/29/21 0958 3     Pain Loc --      Pain Edu? --      Excl. in [place]? --  No data found.  Updated Vital Signs BP 111/76 (BP Location: Right Arm)   Pulse 79   Temp 98.8 F (37.1 C) (Oral)   Resp 18   Ht '5\' 10"'$  (1.778 m)   Wt 220 lb (99.8 kg)   SpO2 98%   BMI 31.57 kg/m   Visual Acuity Right Eye Distance:   Left Eye Distance:   Bilateral Distance:    Right Eye Near:   Left Eye Near:    Bilateral Near:     Physical Exam Vitals reviewed.  Constitutional:      General: He is awake.     Appearance: Normal appearance. He is well-developed. He is not ill-appearing.     Comments: Very pleasant male appears stated age in no acute distress sitting comfortably in exam room  HENT:     Head: Normocephalic and atraumatic.     Right Ear: Tympanic membrane, ear canal and external ear normal.     Left Ear: Tympanic membrane, ear canal and external ear normal. Tympanic membrane is not erythematous or bulging.     Ears:     Comments:  Significant cerumen noted right ear canal.  Unable to visualize TM.  No obvious foreign body or cotton in canal.  After irrigation cerumen was resolved but patient had eczematous ear canal and unable to visualize TM.    Mouth/Throat:     Pharynx: Uvula midline. No oropharyngeal exudate or posterior oropharyngeal erythema.  Cardiovascular:     Rate and Rhythm: Normal rate and regular rhythm.     Heart sounds: Normal heart sounds, S1 normal and S2 normal. No murmur heard. Pulmonary:     Effort: Pulmonary effort is normal. No accessory muscle usage or respiratory distress.     Breath sounds: Normal breath sounds. No stridor. No wheezing, rhonchi or rales.     Comments: Clear to auscultation bilaterally Neurological:     Mental Status: He is alert.  Psychiatric:        Behavior: Behavior is cooperative.      UC Treatments / Results  Labs (all labs ordered are listed, but only abnormal results are displayed) Labs Reviewed - No data to display  EKG   Radiology No results found.  Procedures Procedures (including critical care time)  Medications Ordered in UC Medications - No data to display  Initial Impression / Assessment and Plan / UC Course  I have reviewed the triage vital signs and the nursing notes.  Pertinent labs & imaging results that were available during my care of the patient were reviewed by me and considered in my medical decision making (see chart for details).     No obvious foreign body was noted on exam but cerumen was noted.  This was removed with irrigation in clinic.  This revealed edematous and erythematous ear canal and was unable to visualize the TM.  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  Recommend close follow-up with his provider.  Given erythema will cover with ofloxacin.  Discussed that he should keep ear facing upright a few minutes after applying medication to allow penetration throughout ear canal.  He can use  over-the-counter medications as needed for additional symptom relief.  If anything worsens he is to return for reevaluation.  Final Clinical Impressions(s) / UC Diagnoses   Final diagnoses:  Impacted cerumen of right ear  Infective otitis externa, right     Discharge Instructions      They were able to  rinse the wax out of your ear.  Please use ofloxacin drops twice daily.  Keep ear facing upward for several minutes after applying medication to allow to go complete the ear canal.  Do not put anything in your ear including ear buds, earplugs, Q-tips.  Use Tylenol for pain.  Follow-up with your ENT provider if symptoms not completely resolved.  If anything worsens return for reevaluation.     ED Prescriptions     Medication Sig Dispense Auth. Provider   ofloxacin (FLOXIN) 0.3 % OTIC solution Place 5 drops into the right ear 2 (two) times daily. 5 mL Jaquetta Currier K, PA-C      PDMP not reviewed this encounter.   Terrilee Croak, PA-C 09/29/21 1044

## 2021-10-02 DIAGNOSIS — H6121 Impacted cerumen, right ear: Secondary | ICD-10-CM | POA: Diagnosis not present

## 2021-10-02 DIAGNOSIS — H61301 Acquired stenosis of right external ear canal, unspecified: Secondary | ICD-10-CM | POA: Diagnosis not present

## 2021-10-02 DIAGNOSIS — H6241 Otitis externa in other diseases classified elsewhere, right ear: Secondary | ICD-10-CM | POA: Diagnosis not present

## 2021-10-02 DIAGNOSIS — B369 Superficial mycosis, unspecified: Secondary | ICD-10-CM | POA: Diagnosis not present

## 2021-10-22 ENCOUNTER — Ambulatory Visit: Payer: No Typology Code available for payment source | Admitting: Family Medicine

## 2021-10-22 DIAGNOSIS — H04123 Dry eye syndrome of bilateral lacrimal glands: Secondary | ICD-10-CM | POA: Diagnosis not present

## 2021-11-15 DIAGNOSIS — H52223 Regular astigmatism, bilateral: Secondary | ICD-10-CM | POA: Diagnosis not present

## 2021-11-15 DIAGNOSIS — H5203 Hypermetropia, bilateral: Secondary | ICD-10-CM | POA: Diagnosis not present

## 2021-12-10 ENCOUNTER — Encounter: Payer: Self-pay | Admitting: Family Medicine

## 2021-12-10 ENCOUNTER — Ambulatory Visit (INDEPENDENT_AMBULATORY_CARE_PROVIDER_SITE_OTHER): Payer: No Typology Code available for payment source | Admitting: Family Medicine

## 2021-12-10 VITALS — BP 122/76 | Wt 233.8 lb

## 2021-12-10 DIAGNOSIS — F411 Generalized anxiety disorder: Secondary | ICD-10-CM | POA: Diagnosis not present

## 2021-12-10 MED ORDER — FLUVOXAMINE MALEATE 50 MG PO TABS
50.0000 mg | ORAL_TABLET | Freq: Every day | ORAL | 2 refills | Status: DC
Start: 2021-12-10 — End: 2022-06-13

## 2021-12-10 NOTE — Progress Notes (Signed)
   Subjective:    Patient ID: Jeffrey Compton, male    DOB: 1980/03/19, 41 y.o.   MRN: [id number]  HPI Pt arrives today to maybe be placed on anti depressant. Pt states that he has dealt with depression all his life and has worsened. Patient denies being suicidal Relates a lot of anxiousness Social anxiety as well In addition to this has some obsessive tendencies States it is hard for him to let go of worries He takes her shower 3 times per day He does feel he would do better if he has counseling and medication He is open to this Pt states he may have an ear infection. Unable to hear and seems to be fluid in ear canal. Right ear mostly. Reoccurring issue. He was good to see you today.  Review of Systems     Objective:   Physical Exam General-in no acute distress Eyes-no discharge Lungs-respiratory rate normal, CTA CV-no murmurs,RRR Extremities skin warm dry no edema Neuro grossly normal Behavior normal, alert        Assessment & Plan:  Anxiety-he also has some OCD tendencies In addition to this moderate depression as well as social anxiety.  We did discuss this medication could help as well as counseling could help we also discussed trying to stay active and engaged with social activity but may be in smaller settings in addition to this Luvox start off low-dose follow-up within 6 weeks more than likely bump up dose at that time follow-up sooner if he feels the medication is not getting along with them he denies being suicidal we will go ahead with referral for counseling as well as close follow-up

## 2021-12-11 ENCOUNTER — Other Ambulatory Visit: Payer: Self-pay

## 2021-12-11 DIAGNOSIS — F411 Generalized anxiety disorder: Secondary | ICD-10-CM

## 2021-12-16 ENCOUNTER — Other Ambulatory Visit: Payer: Self-pay | Admitting: Nurse Practitioner

## 2021-12-16 DIAGNOSIS — R1032 Left lower quadrant pain: Secondary | ICD-10-CM

## 2022-01-21 ENCOUNTER — Encounter: Payer: Self-pay | Admitting: Family Medicine

## 2022-01-21 ENCOUNTER — Ambulatory Visit (INDEPENDENT_AMBULATORY_CARE_PROVIDER_SITE_OTHER): Payer: No Typology Code available for payment source | Admitting: Family Medicine

## 2022-01-21 VITALS — BP 115/76 | HR 98 | Ht 70.0 in | Wt 237.0 lb

## 2022-01-21 DIAGNOSIS — E7849 Other hyperlipidemia: Secondary | ICD-10-CM | POA: Diagnosis not present

## 2022-01-21 DIAGNOSIS — F411 Generalized anxiety disorder: Secondary | ICD-10-CM

## 2022-01-21 DIAGNOSIS — G4730 Sleep apnea, unspecified: Secondary | ICD-10-CM | POA: Diagnosis not present

## 2022-01-21 DIAGNOSIS — R5383 Other fatigue: Secondary | ICD-10-CM

## 2022-01-21 NOTE — Progress Notes (Signed)
   Subjective:    Patient ID: Jeffrey Compton, male    DOB: 1980/09/06, 42 y.o.   MRN: [id number]  HPI Follow up for anxiety and medications sates is helping him . no concerns voiced  He relates that medicine is helping but he is only using half the dose he is open to increasing the dose He denies any chest pain shortness of breath He does relate a lot of fatigue tiredness lack of initiative Review of Systems     Objective:   Physical Exam  General-in no acute distress Eyes-no discharge Lungs-respiratory rate normal, CTA CV-no murmurs,RRR Extremities skin warm dry no edema Neuro grossly normal Behavior normal, alert       Assessment & Plan:  1. GAD (generalized anxiety disorder) Increase luvox to 50 mg,pt was taking 1/2 Pt to send [place] update in 30 day via MyChart - Lipid Panel - Comprehensive metabolic panel - TSH - CBC with Differential  2. Sleep apnea, unspecified type Pt with daytime fatigue, doesn't sleep well, he is not sure about snoring Doesn't feel rested when he wakes He prefers home sleep study - Lipid Panel - Comprehensive metabolic panel - TSH - CBC with Differential - Ambulatory referral to Sleep Studies  3. Other hyperlipidemia Hx elevated lipids,healthy diet recommended - Lipid Panel  4. Other fatigue Check thyroid - TSH  Recheck 4 months as well

## 2022-01-27 DIAGNOSIS — H6123 Impacted cerumen, bilateral: Secondary | ICD-10-CM | POA: Diagnosis not present

## 2022-03-28 DIAGNOSIS — H61301 Acquired stenosis of right external ear canal, unspecified: Secondary | ICD-10-CM | POA: Diagnosis not present

## 2022-03-28 DIAGNOSIS — H6123 Impacted cerumen, bilateral: Secondary | ICD-10-CM | POA: Diagnosis not present

## 2022-05-05 ENCOUNTER — Ambulatory Visit
Admission: EM | Admit: 2022-05-05 | Discharge: 2022-05-05 | Disposition: A | Discharge status: Home / Self Care | Payer: No Typology Code available for payment source

## 2022-05-05 DIAGNOSIS — F430 Acute stress reaction: Secondary | ICD-10-CM | POA: Diagnosis not present

## 2022-05-05 NOTE — Discharge Instructions (Addendum)
Please resume the medication as prescribed by your primary care provider.  Also reach out to him if you notice symptoms are getting worse or not improving after couple of days off and resuming the medication.

## 2022-05-05 NOTE — ED Provider Notes (Signed)
[facility]    CSN: [id number] Arrival date & time: 05/05/22  0820      History   Chief Complaint Chief Complaint  Patient presents with   note    HPI Jeffrey Compton is a 42 y.o. male.   Patient presents today needing a note for work.  Reports he is "burned out" at his job and is requesting a couple of days off work.  He works in the [employer] with the city of [place].  No triggering event or recent trauma, reports "I just need a few days off."  Reports he has a history of generalized anxiety disorder, also reports he is on the autism spectrum.  Reports he has a primary care provider and has been taking medication, but ran out about a week ago and is planning to pick up the refill and get started back on it today.  Does not currently have a Veterinary surgeon.  Patient denies suicidal or homicidal ideation at this time.    Past Medical History:  Diagnosis Date   Asperger syndrome    Hyperlipidemia    OCD (obsessive compulsive disorder)     Patient Active Problem List   Diagnosis Date Noted   Rash 03/27/2021   Acute swimmer's ear of right side 02/08/2019   Ear itch 09/28/2018   Impacted cerumen of right ear 09/28/2018   Social phobia 08/04/2016   Multiple benign nevi 12/15/2011   Seborrheic dermatitis 12/15/2011    History reviewed. No pertinent surgical history.     Home Medications    Prior to Admission medications   Medication Sig Start Date End Date Taking? Authorizing Provider  amphetamine-dextroamphetamine (ADDERALL) 10 MG tablet Take 10 mg by mouth daily with breakfast. Dr.Fusco gave him a script a long time ago; takes about once or twice a week.    [provider]  fluvoxaMINE (LUVOX) 50 MG tablet Take 1 tablet (50 mg total) by mouth at bedtime. 12/10/21   Babs Sciara, MD    Family History Family History  Problem Relation Age of Onset   Heart disease Other     Social History Social History   Tobacco Use    Smoking status: Never   Smokeless tobacco: Never  Vaping Use   Vaping Use: Never used  Substance Use Topics   Alcohol use: Not Currently    Comment: occasional   Drug use: No     Allergies   Celexa [citalopram hydrobromide] and Keflex [cephalexin]   Review of Systems Review of Systems Per HPI  Physical Exam Triage Vital Signs ED Triage Vitals  Enc Vitals Group     BP 05/05/22 0829 134/76     Pulse Rate 05/05/22 0829 81     Resp 05/05/22 0829 20     Temp 05/05/22 0829 (!) 97.4 F (36.3 C)     Temp Source 05/05/22 0829 Oral     SpO2 05/05/22 0829 98 %     Weight --      Height --      Head Circumference --      Peak Flow --      Pain Score 05/05/22 0831 0     Pain Loc --      Pain Edu? --      Excl. in GC? --    No data found.  Updated Vital Signs BP 134/76 (BP Location: Right Arm)   Pulse 81   Temp (!) 97.4 F (36.3 C) (Oral)   Resp 20  SpO2 98%   Visual Acuity Right Eye Distance:   Left Eye Distance:   Bilateral Distance:    Right Eye Near:   Left Eye Near:    Bilateral Near:     Physical Exam Vitals and nursing note reviewed.  Constitutional:      General: He is not in acute distress.    Appearance: Normal appearance. He is not toxic-appearing.  HENT:     Head: Normocephalic and atraumatic.     Mouth/Throat:     Mouth: Mucous membranes are moist.     Pharynx: Oropharynx is clear.  Eyes:     General: No scleral icterus.    Extraocular Movements: Extraocular movements intact.  Pulmonary:     Effort: Pulmonary effort is normal. No respiratory distress.  Neurological:     Mental Status: He is alert and oriented to person, place, and time.  Psychiatric:        Behavior: Behavior is cooperative.      UC Treatments / Results  Labs (all labs ordered are listed, but only abnormal results are displayed) Labs Reviewed - No data to display  EKG   Radiology No results found.  Procedures Procedures (including critical care  time)  Medications Ordered in UC Medications - No data to display  Initial Impression / Assessment and Plan / UC Course  I have reviewed the triage vital signs and the nursing notes.  Pertinent labs & imaging results that were available during my care of the patient were reviewed by me and considered in my medical decision making (see chart for details).   Patient is well-appearing, normotensive, afebrile, not tachycardic, not tachypneic, oxygenating well on room air.    1. Acute stress reaction Note given for work Recommended resuming previously prescribed medication as prescribed by PCP Recommended close follow-up with PCP if no improvement or worsening of symptoms despite treatment and time off work  The patient was given the opportunity to ask questions.  All questions answered to their satisfaction.  The patient is in agreement to this plan.    Final Clinical Impressions(s) / UC Diagnoses   Final diagnoses:  Acute stress reaction     Discharge Instructions      Please resume the medication as prescribed by your primary care provider.  Also reach out to him if you notice symptoms are getting worse or not improving after couple of days off and resuming the medication.    ED Prescriptions   None    PDMP not reviewed this encounter.   Valentino Nose, NP 05/05/22 [phone]

## 2022-05-05 NOTE — ED Triage Notes (Signed)
Pt reports he needs an out of work note because he is "burnt out" due to his anxiety. . Needs note until Wed.

## 2022-05-08 NOTE — Patient Instructions (Signed)
Jeffrey Compton , Thank you for taking time to come for your Medicare Wellness Visit. I appreciate your ongoing commitment to your health goals. Please review the following plan we discussed and let me know if I can assist you in the future.   These are the goals we discussed:  Goals      Weight (lb) < 200 lb (90.7 kg)     Would like to lose 30#        This is a list of the screening recommended for you and due dates:  Health Maintenance  Topic Date Due   HIV Screening  Never done   Hepatitis C Screening: USPSTF Recommendation to screen - Ages 70-79 yo.  Never done   COVID-19 Vaccine (3 - Moderna risk series) 09/19/2019   Flu Shot  08/21/2022   Medicare Annual Wellness Visit  05/09/2023   DTaP/Tdap/Td vaccine (2 - Td or Tdap) 04/24/2025   HPV Vaccine  Aged Out    Advanced directives: Forms are available if you choose in the future to pursue completion.  This is recommended in order to make sure that your health wishes are honored in the event that you are unable to verbalize them to the provider.    Conditions/risks identified: Aim for 30 minutes of exercise or brisk walking, 6-8 glasses of water, and 5 servings of fruits and vegetables each day.  Next appointment: Follow up in one year for your annual wellness visit   Preventive Care 56-35 Years Old, Male Preventive care refers to lifestyle choices and visits with your health care provider that can promote health and wellness. Preventive care visits are also called wellness exams. What can I expect for my preventive care visit? Counseling During your preventive care visit, your health care provider may ask about your: Medical history, including: Past medical problems. Family medical history. Current health, including: Emotional well-being. Home life and relationship well-being. Sexual activity. Lifestyle, including: Alcohol, nicotine or tobacco, and drug use. Access to firearms. Diet, exercise, and sleep habits. Safety  issues such as seatbelt and bike helmet use. Sunscreen use. Work and work Astronomer. Physical exam Your health care provider may check your: Height and weight. These may be used to calculate your BMI (body mass index). BMI is a measurement that tells if you are at a healthy weight. Waist circumference. This measures the distance around your waistline. This measurement also tells if you are at a healthy weight and may help predict your risk of certain diseases, such as type 2 diabetes and high blood pressure. Heart rate and blood pressure. Body temperature. Skin for abnormal spots. What immunizations do I need? Vaccines are usually given at various ages, according to a schedule. Your health care provider will recommend vaccines for you based on your age, medical history, and lifestyle or other factors, such as travel or where you work. What tests do I need? Screening Your health care provider may recommend screening tests for certain conditions. This may include: Lipid and cholesterol levels. Diabetes screening. This is done by checking your blood sugar (glucose) after you have not eaten for a while (fasting). Hepatitis B test. Hepatitis C test. HIV (human immunodeficiency virus) test. STI (sexually transmitted infection) testing, if you are at risk. Talk with your health care provider about your test results, treatment options, and if necessary, the need for more tests. Follow these instructions at home: Eating and drinking  Eat a healthy diet that includes fresh fruits and vegetables, whole grains, lean protein, and low-fat  dairy products. Drink enough fluid to keep your urine pale yellow. Take vitamin and mineral supplements as recommended by your health care provider. Do not drink alcohol if your health care provider tells you not to drink. If you drink alcohol: Limit how much you have to 0-2 drinks a day. Know how much alcohol is in your drink. In the [place] one drink equals one  12 oz bottle of beer (355 mL), one 5 oz glass of wine (148 mL), or one 1 oz glass of hard liquor (44 mL). Lifestyle Brush your teeth every morning and night with fluoride toothpaste. Floss one time each day. Exercise for at least 30 minutes 5 or more days each week. Do not use any products that contain nicotine or tobacco. These products include cigarettes, chewing tobacco, and vaping devices, such as e-cigarettes. If you need help quitting, ask your health care provider. Do not use drugs. If you are sexually active, practice safe sex. Use a condom or other form of protection to prevent STIs. Find healthy ways to manage stress, such as: Meditation, yoga, or listening to music. Journaling. Talking to a trusted person. Spending time with friends and family. Minimize exposure to UV radiation to reduce your risk of skin cancer. Safety Always wear your seat belt while driving or riding in a vehicle. Do not drive: If you have been drinking alcohol. Do not ride with someone who has been drinking. If you have been using any mind-altering substances or drugs. While texting. When you are tired or distracted. Wear a helmet and other protective equipment during sports activities. If you have firearms in your house, make sure you follow all gun safety procedures. Seek help if you have been physically or sexually abused. What's next? Go to your health care provider once a year for an annual wellness visit. Ask your health care provider how often you should have your eyes and teeth checked. Stay up to date on all vaccines. This information is not intended to replace advice given to you by your health care provider. Make sure you discuss any questions you have with your health care provider. Document Revised: 07/04/2020 Document Reviewed: 07/04/2020 Elsevier Patient Education  2022 [employer].

## 2022-05-08 NOTE — Progress Notes (Signed)
Subjective:   Jeffrey Compton is a 42 y.o. male who presents for Medicare Annual/Subsequent preventive examination.  I connected with  Jeffrey Compton on 05/09/22 by a audio enabled telemedicine application and verified that I am speaking with the correct person using two identifiers.  Patient Location: Home  Provider Location: Office/Clinic  I discussed the limitations of evaluation and management by telemedicine. The patient expressed understanding and agreed to proceed.  Review of Systems     Cardiac Risk Factors include: male gender     Objective:    Today's Vitals   05/09/22 1438  Weight: 237 lb (107.5 kg)  Height:  (1.778 m)   Body mass index is 34.01 kg/m.     05/09/2022    2:36 PM 04/30/2021    2:33 PM 03/12/2018    9:51 AM 02/17/2018    4:02 PM 04/25/2015    1:50 PM  Advanced Directives  Does Patient Have a Medical Advance Directive? No No No No No  Would patient like information on creating a medical advance directive? No - Patient declined No - Patient declined No - Patient declined No - Patient declined     Current Medications (verified) Outpatient Encounter Medications as of 05/09/2022  Medication Sig   amphetamine-dextroamphetamine (ADDERALL) 10 MG tablet Take 10 mg by mouth daily with breakfast. JeffreyFusco gave him a script a long time ago; takes about once or twice a week.   fluvoxaMINE (LUVOX) 50 MG tablet Take 1 tablet (50 mg total) by mouth at bedtime.   No facility-administered encounter medications on file as of 05/09/2022.    Allergies (verified) Celexa [citalopram hydrobromide] and Keflex [cephalexin]   History: Past Medical History:  Diagnosis Date   Asperger syndrome    Hyperlipidemia    OCD (obsessive compulsive disorder)    History reviewed. No pertinent surgical history. Family History  Problem Relation Age of Onset   Heart disease Other    Social History   Socioeconomic History   Marital status: Single    Spouse  name: Not on file   Number of children: Not on file   Years of education: Not on file   Highest education level: Not on file  Occupational History   Not on file  Tobacco Use   Smoking status: Never   Smokeless tobacco: Never  Vaping Use   Vaping Use: Never used  Substance and Sexual Activity   Alcohol use: Not Currently    Comment: occasional   Drug use: No   Sexual activity: Not on file  Other Topics Concern   Not on file  Social History Narrative   Not on file   Social Determinants of Health   Financial Resource Strain: Low Risk  (05/09/2022)   Overall Financial Resource Strain (CARDIA)    Difficulty of Paying Living Expenses: Not hard at all  Food Insecurity: No Food Insecurity (05/09/2022)   Hunger Vital Sign    Worried About Running Out of Food in the Last Year: Never true    Ran Out of Food in the Last Year: Never true  Transportation Needs: No Transportation Needs (05/09/2022)   PRAPARE - Administrator, Civil Service (Medical): No    Lack of Transportation (Non-Medical): No  Physical Activity: Sufficiently Active (05/09/2022)   Exercise Vital Sign    Days of Exercise per Week: 5 days    Minutes of Exercise per Session: 60 min  Stress: No Stress Concern Present (05/09/2022)   [employer] of  Occupational Health - Occupational Stress Questionnaire    Feeling of Stress : Only a little  Social Connections: Socially Isolated (05/09/2022)   Social Connection and Isolation Panel [NHANES]    Frequency of Communication with Friends and Family: More than three times a week    Frequency of Social Gatherings with Friends and Family: More than three times a week    Attends Religious Services: Never    Database administrator or Organizations: No    Attends Engineer, structural: Never    Marital Status: Never married    Tobacco Counseling Counseling given: Not Answered   Clinical Intake:  Pre-visit preparation completed: Yes  Pain : No/denies  pain  Diabetes: No  How often do you need to have someone help you when you read instructions, pamphlets, or other written materials from your doctor or pharmacy?: 1 - Never  Diabetic?No   Interpreter Needed?: No  Information entered by :: Jeffrey Fantasia LPN   Activities of Daily Living    05/09/2022    2:35 PM  In your present state of health, do you have any difficulty performing the following activities:  Hearing? 0  Vision? 0  Difficulty concentrating or making decisions? 0  Walking or climbing stairs? 0  Dressing or bathing? 0  Doing errands, shopping? 0  Preparing Food and eating ? N  Using the Toilet? N  In the past six months, have you accidently leaked urine? N  Do you have problems with loss of bowel control? N  Managing your Medications? N  Managing your Finances? N  Housekeeping or managing your Housekeeping? N    Patient Care Team: Jeffrey Sciara, MD as PCP - General (Family Medicine) [facility] Jeffrey Hacker, PA-C as Physician Assistant (Physician Assistant)  Indicate any recent Medical Services you may have received from other than [facility] providers in the past year (date may be approximate).     Assessment:   This is a routine wellness examination for [name].  Hearing/Vision screen Hearing Screening - Comments:: Denies hearing difficulties   Vision Screening - Comments:: Wears rx glasses - up to date with routine eye exams with Jeffrey Compton    Dietary issues and exercise activities discussed: Current Exercise Habits: The patient has a physically strenuous job, but has no regular exercise apart from work.   Goals Addressed   None    Depression Screen    05/09/2022    2:40 PM 01/21/2022    1:48 PM 12/10/2021    1:53 PM 04/30/2021    2:31 PM 12/20/2020    9:00 AM 01/24/2020    1:07 PM 11/14/2019    7:36 PM  PHQ 2/9 Scores  PHQ - 2 Score 0 4 4 0 0 0 5  PHQ- 9 Score  Fall Risk    05/09/2022    2:39 PM  01/21/2022    1:48 PM 12/10/2021    1:53 PM 04/30/2021    2:34 PM 12/20/2020    9:00 AM  Fall Risk   Falls in the past year? 0 0 0 0 0  Number falls in past yr: 0 0 0 0 0  Injury with Fall? 0 0 0 0 0  Risk for fall due to : No Fall Risks No Fall Risks No Fall Risks No Fall Risks No Fall Risks  Follow up Falls prevention discussed;Education provided;Falls evaluation completed Falls evaluation completed Falls evaluation completed Falls prevention discussed Falls  evaluation completed    FALL RISK PREVENTION PERTAINING TO THE HOME:  Any stairs in or around the home? No  If so, are there any without handrails? No  Home free of loose throw rugs in walkways, pet beds, electrical cords, etc? Yes  Adequate lighting in your home to reduce risk of falls? Yes   ASSISTIVE DEVICES UTILIZED TO PREVENT FALLS:  Life alert? No  Use of a cane, walker or w/c? No  Grab bars in the bathroom? Yes  Shower chair or bench in shower? No  Elevated toilet seat or a handicapped toilet? No   TIMED UP AND GO:  Was the test performed? No . Telephonic visit   Cognitive Function:        05/09/2022    2:44 PM 04/30/2021    2:35 PM  6CIT Screen  What Year? 0 points 0 points  What month? 0 points 0 points  What time? 0 points 0 points  Count back from 20 0 points 0 points  Months in reverse 0 points 0 points  Repeat phrase 0 points 2 points  Total Score 0 points 2 points    Immunizations Immunization History  Administered Date(s) Administered   Moderna Sars-Covid-2 Vaccination 08/01/2019, 08/22/2019   Tdap 04/25/2015    TDAP status: Up to date  Flu Vaccine status: Up to date  Covid-19 vaccine status: Information provided on how to obtain vaccines.   Qualifies for Shingles Vaccine? No    Screening Tests Health Maintenance  Topic Date Due   HIV Screening  Never done   Hepatitis C Screening  Never done   COVID-19 Vaccine (3 - Moderna risk series) 09/19/2019   INFLUENZA VACCINE  08/21/2022    Medicare Annual Wellness (AWV)  05/09/2023   DTaP/Tdap/Td (2 - Td or Tdap) 04/24/2025   HPV VACCINES  Aged Out    Health Maintenance  Health Maintenance Due  Topic Date Due   HIV Screening  Never done   Hepatitis C Screening  Never done   COVID-19 Vaccine (3 - Moderna risk series) 09/19/2019    Lung Cancer Screening: (Low Dose CT Chest recommended if Age 80-80 years, 30 pack-year currently smoking OR have quit w/in 15years.) does not qualify.   Lung Cancer Screening Referral: n/a  Additional Screening:  Hepatitis C Screening: does qualify;  Vision Screening: Recommended annual ophthalmology exams for early detection of glaucoma and other disorders of the eye. Is the patient up to date with their annual eye exam?  Yes  Who is the provider or what is the name of the office in which the patient attends annual eye exams? Jeffrey Compton  If pt is not established with a provider, would they like to be referred to a provider to establish care? No .   Dental Screening: Recommended annual dental exams for proper oral hygiene  Community Resource Referral / Chronic Care Management: CRR required this visit?  No   CCM required this visit?  No      Plan:     I have personally reviewed and noted the following in the patient's chart:   Medical and social history Use of alcohol, tobacco or illicit drugs  Current medications and supplements including opioid prescriptions. Patient is not currently taking opioid prescriptions. Functional ability and status Nutritional status Physical activity Advanced directives List of other physicians Hospitalizations, surgeries, and ER visits in previous 12 months Vitals Screenings to include cognitive, depression, and falls Referrals and appointments  In addition, I have reviewed and discussed  with patient certain preventive protocols, quality metrics, and best practice recommendations. A written personalized care plan for preventive services as well  as general preventive health recommendations were provided to patient.     Durwin Nora, [place]   1/61/0960   Due to this being a virtual visit, the after visit summary with patients personalized plan was offered to patient via mail or my-chart.  Patient would like to access on my-chart  Nurse Notes: No concerns

## 2022-05-09 ENCOUNTER — Ambulatory Visit: Payer: No Typology Code available for payment source

## 2022-05-09 VITALS — Ht 70.0 in | Wt 237.0 lb

## 2022-05-09 DIAGNOSIS — Z0000 Encounter for general adult medical examination without abnormal findings: Secondary | ICD-10-CM | POA: Diagnosis not present

## 2022-05-22 ENCOUNTER — Ambulatory Visit: Payer: No Typology Code available for payment source | Admitting: Family Medicine

## 2022-06-13 ENCOUNTER — Ambulatory Visit: Payer: No Typology Code available for payment source | Admitting: Family Medicine

## 2022-06-13 ENCOUNTER — Encounter: Payer: Self-pay | Admitting: Family Medicine

## 2022-06-13 VITALS — BP 126/77 | HR 86 | Temp 97.7°F | Ht 70.0 in | Wt 234.0 lb

## 2022-06-13 DIAGNOSIS — F428 Other obsessive-compulsive disorder: Secondary | ICD-10-CM

## 2022-06-13 DIAGNOSIS — F411 Generalized anxiety disorder: Secondary | ICD-10-CM

## 2022-06-13 DIAGNOSIS — F988 Other specified behavioral and emotional disorders with onset usually occurring in childhood and adolescence: Secondary | ICD-10-CM

## 2022-06-13 DIAGNOSIS — F509 Eating disorder, unspecified: Secondary | ICD-10-CM | POA: Diagnosis not present

## 2022-06-13 MED ORDER — AMPHETAMINE-DEXTROAMPHETAMINE 10 MG PO TABS: ORAL_TABLET | ORAL | 0 refills | Status: DC

## 2022-06-13 MED ORDER — FLUVOXAMINE MALEATE 50 MG PO TABS
50.0000 mg | ORAL_TABLET | Freq: Every day | ORAL | 6 refills | Status: DC
Start: 2022-06-13 — End: 2022-09-17

## 2022-06-13 NOTE — Progress Notes (Unsigned)
   Subjective:    Patient ID: Jeffrey Compton, male    DOB: 01-Aug-1980, 42 y.o.   MRN: [id number]  HPI Patient has multiple underlying issues including social anxiety, OCD tendencies, history of mild anxiety,  Folllow up GAD - no concerns voiced he denies being depressed but does state mild anxiety OCD tendencies are doing much better medicine is going well for him  He states he has a difficult time staying focused and attentive.  He states his mind thinks about multiple different things.  When he is working the job where there is a lot of details going on he has a hard time keeping up he states this is been this way ever since when he was in grade school he states that a local doctor prescribed Adderall in the past and that did help and he would like to have some that he could take on the days that he is having to be more attentive and focused Review of Systems     Objective:   Physical Exam General-in no acute distress Eyes-no discharge Lungs-respiratory rate normal, CTA CV-no murmurs,RRR Extremities skin warm dry no edema Neuro grossly normal Behavior normal, alert        Assessment & Plan:  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 GAD (generalized anxiety disorder) Patient feels he is doing pretty good in this area  3. Other obsessive-compulsive disorders luvox he would like to stick with this dose  4. Eating disorder, unspecified type We will look into other options-medications  Follow-up 6 months sooner if utilizing Adderall more often

## 2022-06-14 DIAGNOSIS — F411 Generalized anxiety disorder: Secondary | ICD-10-CM | POA: Insufficient documentation

## 2022-06-14 DIAGNOSIS — F988 Other specified behavioral and emotional disorders with onset usually occurring in childhood and adolescence: Secondary | ICD-10-CM | POA: Insufficient documentation

## 2022-07-08 DIAGNOSIS — H900 Conductive hearing loss, bilateral: Secondary | ICD-10-CM | POA: Diagnosis not present

## 2022-07-08 DIAGNOSIS — H6123 Impacted cerumen, bilateral: Secondary | ICD-10-CM | POA: Diagnosis not present

## 2022-07-08 DIAGNOSIS — H61301 Acquired stenosis of right external ear canal, unspecified: Secondary | ICD-10-CM | POA: Diagnosis not present

## 2022-07-11 ENCOUNTER — Encounter: Payer: Self-pay | Admitting: Family Medicine

## 2022-08-12 ENCOUNTER — Ambulatory Visit (INDEPENDENT_AMBULATORY_CARE_PROVIDER_SITE_OTHER): Payer: No Typology Code available for payment source | Admitting: Family Medicine

## 2022-08-12 VITALS — BP 132/88 | HR 77 | Temp 98.6°F | Ht 70.0 in | Wt 235.8 lb

## 2022-08-12 DIAGNOSIS — R0781 Pleurodynia: Secondary | ICD-10-CM | POA: Insufficient documentation

## 2022-08-12 MED ORDER — MELOXICAM 15 MG PO TABS: 15.0000 mg | ORAL_TABLET | Freq: Every day | ORAL | 0 refills | Status: DC | PRN

## 2022-08-12 NOTE — Assessment & Plan Note (Signed)
Suspect that this is MSK in origin and from the work that he is doing.  Tender on exam.  Treating with rest and meloxicam.

## 2022-08-12 NOTE — Patient Instructions (Signed)
Rest. Heat.  Medication as directed.  This will take time to resolve.

## 2022-08-12 NOTE — Progress Notes (Signed)
Subjective:  Patient ID: Jeffrey Compton, male    DOB: 1980-03-14  Age: 42 y.o. MRN: [id number]  CC: Chief Complaint  Patient presents with   Abdominal Pain    HPI:  42 year old male presents for evaluation of the above.  Patient reports that he is having pain of the left lateral ribs below the axilla.  He states that it has been going on for over a week.  Worse with activity and movements.  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  No relieving factors.  No other reported symptoms.  No other complaints.  Patient Active Problem List   Diagnosis Date Noted   Rib pain on left side 08/12/2022   Attention deficit disorder (ADD) without hyperactivity 06/14/2022   GAD (generalized anxiety disorder) 06/14/2022   Social phobia 08/04/2016   Multiple benign nevi 12/15/2011   Seborrheic dermatitis 12/15/2011    Social Hx   Social History   Socioeconomic History   Marital status: Single    Spouse name: Not on file   Number of children: Not on file   Years of education: Not on file   Highest education level: Not on file  Occupational History   Not on file  Tobacco Use   Smoking status: Never   Smokeless tobacco: Never  Vaping Use   Vaping status: Never Used  Substance and Sexual Activity   Alcohol use: Not Currently    Comment: occasional   Drug use: No   Sexual activity: Not on file  Other Topics Concern   Not on file  Social History Narrative   Not on file   Social Determinants of Health   Financial Resource Strain: Low Risk  (05/09/2022)   Overall Financial Resource Strain (CARDIA)    Difficulty of Paying Living Expenses: Not hard at all  Food Insecurity: Low Risk  (07/08/2022)   [facility]   Food vital sign    Within the past 12 months, you worried that your food would run out before you got money to buy more: Never true    Within the past 12 months, the food you bought  just didn't last and you didn't have money to get more. : Never true  Transportation Needs: No Transportation Needs (07/08/2022)   [facility]   Transportation    In the past 12 months, has lack of reliable transportation kept you from medical appointments, meetings, work or from getting things needed for daily living? : No  Physical Activity: Sufficiently Active (05/09/2022)   Exercise Vital Sign    Days of Exercise per Week: 5 days    Minutes of Exercise per Session: 60 min  Stress: No Stress Concern Present (05/09/2022)   [employer] of Occupational Health - Occupational Stress Questionnaire    Feeling of Stress : Only a little  Social Connections: Socially Isolated (05/09/2022)   Social Connection and Isolation Panel [NHANES]    Frequency of Communication with Friends and Family: More than three times a week    Frequency of Social Gatherings with Friends and Family: More than three times a week    Attends Religious Services: Never    Database administrator or Organizations: No    Attends Banker Meetings: Never    Marital Status: Never married    Review of Systems Per HPI  Objective:  BP 132/88   Pulse 77  Temp 98.6 F (37 C)   Ht 5\' 10"  (1.778 m)   Wt 235 lb 12.8 oz (107 kg)   SpO2 96%   BMI 33.83 kg/m      08/12/2022   11:02 AM 06/13/2022   10:37 AM 05/09/2022    2:38 PM  BP/Weight  Systolic BP 132 126 --  Diastolic BP 88 77 --  Wt. (Lbs) 235.8 234 237  BMI 33.83 kg/m2 33.58 kg/m2 34.01 kg/m2    Physical Exam Constitutional:      Appearance: Normal appearance.  Eyes:     General:        Right eye: No discharge.        Left eye: No discharge.     Conjunctiva/sclera: Conjunctivae normal.  Cardiovascular:     Rate and Rhythm: Normal rate and regular rhythm.  Pulmonary:     Effort: Pulmonary effort is normal.     Breath sounds: Normal breath sounds. No wheezing or rales.  Chest:       Comments: Patient with  tenderness to palpation at the labeled location.  No bruising. Neurological:     Mental Status: He is alert.  Psychiatric:     Comments: Flat affect.     Lab Results  Component Value Date   WBC 5.4 02/01/2019   HGB 15.4 02/01/2019   HCT 45.8 02/01/2019   PLT 263 02/01/2019   GLUCOSE 96 02/22/2018   CHOL 218 (H) 02/22/2018   TRIG 256 (H) 02/22/2018   HDL 27 (L) 02/22/2018   LDLCALC 140 (H) 02/22/2018   ALT 40 02/17/2018   AST 36 02/17/2018   NA 139 02/22/2018   K 5.2 02/22/2018   CL 99 02/22/2018   CREATININE 1.06 02/22/2018   BUN 11 02/22/2018   CO2 25 02/22/2018     Assessment & Plan:   Problem List Items Addressed This Visit       Other   Rib pain on left side - Primary    Suspect that this is MSK in origin and from the work that he is doing.  Tender on exam.  Treating with rest and meloxicam.       Meds ordered this encounter  Medications   meloxicam (MOBIC) 15 MG tablet    Sig: Take 1 tablet (15 mg total) by mouth daily as needed for pain.    Dispense:  30 tablet    Refill:  0    Follow-up:  Return if symptoms worsen or fail to improve.  Everlene Other DO [facility]

## 2022-08-14 DIAGNOSIS — H60331 Swimmer's ear, right ear: Secondary | ICD-10-CM | POA: Diagnosis not present

## 2022-08-14 DIAGNOSIS — H6122 Impacted cerumen, left ear: Secondary | ICD-10-CM | POA: Diagnosis not present

## 2022-09-04 ENCOUNTER — Encounter: Payer: Self-pay | Admitting: *Deleted

## 2022-09-11 ENCOUNTER — Telehealth: Payer: Self-pay

## 2022-09-11 NOTE — Telephone Encounter (Signed)
IF a referral needs put in please send Dr Delice Bison [address] [place]  [phone]

## 2022-09-11 NOTE — Telephone Encounter (Signed)
Certainly hard to tell if he is having any symptoms or not.  He has an upcoming appointment in several days we will discuss at that time

## 2022-09-17 ENCOUNTER — Ambulatory Visit (HOSPITAL_COMMUNITY)
Admission: RE | Admit: 2022-09-17 | Discharge: 2022-09-17 | Disposition: A | Discharge status: Home / Self Care | Payer: No Typology Code available for payment source | Source: Ambulatory Visit | Attending: Family Medicine | Admitting: Family Medicine

## 2022-09-17 ENCOUNTER — Ambulatory Visit: Payer: No Typology Code available for payment source | Admitting: Family Medicine

## 2022-09-17 VITALS — BP 132/80 | HR 79 | Temp 98.2°F | Ht 70.0 in | Wt 234.0 lb

## 2022-09-17 DIAGNOSIS — R61 Generalized hyperhidrosis: Secondary | ICD-10-CM | POA: Insufficient documentation

## 2022-09-17 DIAGNOSIS — F439 Reaction to severe stress, unspecified: Secondary | ICD-10-CM | POA: Diagnosis not present

## 2022-09-17 DIAGNOSIS — R0789 Other chest pain: Secondary | ICD-10-CM | POA: Diagnosis not present

## 2022-09-17 DIAGNOSIS — R079 Chest pain, unspecified: Secondary | ICD-10-CM | POA: Diagnosis not present

## 2022-09-17 MED ORDER — DULOXETINE HCL 20 MG PO CPEP: 20.0000 mg | ORAL_CAPSULE | Freq: Every day | ORAL | 2 refills | Status: DC

## 2022-09-17 NOTE — Progress Notes (Signed)
   Subjective:    Patient ID: Jeffrey Compton, male    DOB: April 14, 1980, 42 y.o.   MRN: [id number]  HPI Chest pain , sob, cold sweats on and off brought on by physical activity and or stress going on and off for 2 weeks  Patient with intermittent sharp pains left side of the chest at times feels like a tightness other times like a sharp pain also at times has a hard time taking a deep breath without pain had EKG done today which was normal-appearing Has strong family history of heart disease No definitive angina symptoms Under a lot of stress at work with not enough workers to do the job which is putting a lot of extra stress on him Denies feeling depressed but does state he has lack of enjoyment with work because of the situation Review of Systems     Objective:   Physical Exam General-in no acute distress Eyes-no discharge Lungs-respiratory rate normal, CTA CV-no murmurs,RRR Extremities skin warm dry no edema Neuro grossly normal Behavior normal, alert        Assessment & Plan:  1. Other chest pain I doubt that this is coronary artery disease currently but does need further looking into Patient has large concerns for coronary artery disease has early family history patient would benefit from cardiology consult perhaps stress test as well - EKG 12-Lead - DG Chest 2 View - Sedimentation Rate - C-reactive protein - TSH - CBC with Differential - Comprehensive metabolic panel - Ambulatory referral to Cardiology  2. Night sweats Chest x-ray lab work ordered - DG Chest 2 View - Sedimentation Rate - C-reactive protein - TSH - CBC with Differential - Comprehensive metabolic panel - Ambulatory referral to Cardiology  3. Stress We did discuss medications including Cymbalta 20 mg 1 daily - DG Chest 2 View - Sedimentation Rate - C-reactive protein - TSH - CBC with Differential - Comprehensive metabolic panel - Ambulatory referral to Cardiology

## 2022-09-19 DIAGNOSIS — R0789 Other chest pain: Secondary | ICD-10-CM | POA: Diagnosis not present

## 2022-09-19 DIAGNOSIS — F439 Reaction to severe stress, unspecified: Secondary | ICD-10-CM | POA: Diagnosis not present

## 2022-09-19 DIAGNOSIS — R61 Generalized hyperhidrosis: Secondary | ICD-10-CM | POA: Diagnosis not present

## 2022-09-20 LAB — COMPREHENSIVE METABOLIC PANEL
ALT: 38 IU/L (ref 0–44)
AST: 26 IU/L (ref 0–40)
Albumin: 4.2 g/dL (ref 4.1–5.1)
Alkaline Phosphatase: 65 IU/L (ref 44–121)
BUN/Creatinine Ratio: 13 (ref 9–20)
BUN: 13 mg/dL (ref 6–20)
Bilirubin Total: 0.4 mg/dL (ref 0.0–1.2)
CO2: 23 mmol/L (ref 20–29)
Calcium: 9 mg/dL (ref 8.7–10.2)
Chloride: 104 mmol/L (ref 96–106)
Creatinine, Ser: 1.04 mg/dL (ref 0.76–1.27)
Globulin, Total: 2.6 g/dL (ref 1.5–4.5)
Glucose: 89 mg/dL (ref 70–99)
Potassium: 4.8 mmol/L (ref 3.5–5.2)
Sodium: 140 mmol/L (ref 134–144)
Total Protein: 6.8 g/dL (ref 6.0–8.5)
eGFR: 94 mL/min/{1.73_m2} (ref >59)

## 2022-09-20 LAB — SEDIMENTATION RATE: Sed Rate: 7 mm/h (ref 0–15)

## 2022-09-20 LAB — CBC WITH DIFFERENTIAL/PLATELET
Basophils Absolute: 0 10*3/uL (ref 0.0–0.2)
Basos: 1 %
EOS (ABSOLUTE): 0.1 10*3/uL (ref 0.0–0.4)
Eos: 2 %
Hematocrit: 42.7 % (ref 37.5–51.0)
Hemoglobin: 14.7 g/dL (ref 13.0–17.7)
Immature Grans (Abs): 0 10*3/uL (ref 0.0–0.1)
Immature Granulocytes: 0 %
Lymphocytes Absolute: 1.6 10*3/uL (ref 0.7–3.1)
Lymphs: 34 %
MCH: 30.2 pg (ref 26.6–33.0)
MCHC: 34.4 g/dL (ref 31.5–35.7)
MCV: 88 fL (ref 79–97)
Monocytes Absolute: 0.5 10*3/uL (ref 0.1–0.9)
Monocytes: 10 %
Neutrophils Absolute: 2.5 10*3/uL (ref 1.4–7.0)
Neutrophils: 53 %
Platelets: 256 10*3/uL (ref 150–450)
RBC: 4.87 x10E6/uL (ref 4.14–5.80)
RDW: 12.5 % (ref 11.6–15.4)
WBC: 4.8 10*3/uL (ref 3.4–10.8)

## 2022-09-20 LAB — TSH: TSH: 1.56 u[IU]/mL (ref 0.450–4.500)

## 2022-09-20 LAB — C-REACTIVE PROTEIN: CRP: 2 mg/L (ref 0–10)

## 2022-10-15 DIAGNOSIS — H938X1 Other specified disorders of right ear: Secondary | ICD-10-CM | POA: Diagnosis not present

## 2022-10-15 DIAGNOSIS — H6122 Impacted cerumen, left ear: Secondary | ICD-10-CM | POA: Diagnosis not present

## 2022-10-15 DIAGNOSIS — H900 Conductive hearing loss, bilateral: Secondary | ICD-10-CM | POA: Diagnosis not present

## 2022-10-15 DIAGNOSIS — H61301 Acquired stenosis of right external ear canal, unspecified: Secondary | ICD-10-CM | POA: Diagnosis not present

## 2022-10-21 ENCOUNTER — Ambulatory Visit: Payer: No Typology Code available for payment source | Admitting: Family Medicine

## 2022-10-21 VITALS — BP 122/79 | HR 95 | Temp 98.6°F | Ht 70.0 in | Wt 234.2 lb

## 2022-10-21 DIAGNOSIS — R61 Generalized hyperhidrosis: Secondary | ICD-10-CM | POA: Diagnosis not present

## 2022-10-21 DIAGNOSIS — R0789 Other chest pain: Secondary | ICD-10-CM

## 2022-10-21 DIAGNOSIS — F411 Generalized anxiety disorder: Secondary | ICD-10-CM

## 2022-10-21 MED ORDER — DULOXETINE HCL 20 MG PO CPEP: 20.0000 mg | ORAL_CAPSULE | Freq: Every day | ORAL | 4 refills | Status: DC

## 2022-10-21 NOTE — Progress Notes (Signed)
   Subjective:    Patient ID: Jeffrey Compton, male    DOB: [birth year], 42 y.o.   MRN: [id number]  HPI Here today for follow-up States he is no longer having chest tightness pressure pain or shortness of breath.  Tries to eat healthy.  Cymbalta is helping him with his anxiety.  Energy levels are doing okay.  He stays fairly busy with his work He does have some mild ADD that Adderall allows him to stay more focused and attentive when he is working he takes 1 in the morning on the days he works.  He works 3 days a week.  He does not use on other days.  He does not need a refill currently.  He feels that the 20 mg Cymbalta is doing a good job.  Denies being depressed. As for his night sweats he has only had 2 of those in the past 4 weeks and he feels good otherwise he is not losing weight appetite doing well  Review of Systems     Objective:   Physical Exam ert Overall he is doing well currently today.  Today's visit was spent primarily discussing his follow-up on his health issues and his medication      Assessment & Plan:  1. GAD (generalized anxiety disorder) Patient doing well on the Cymbalta he would like to stick with the current dose.  2. Other chest pain This has resolved  3. Night sweats These are doing better there is only been 2 of them he was warned that if he starts having increased frequency to let us know I recommend a follow-up visit.  Approximately 4 months if frequency becomes worse I would recommend repeat lab testing and perhaps even CT scan abdomen and chest Recent chest x-ray and lab work looked good

## 2022-10-28 ENCOUNTER — Other Ambulatory Visit: Payer: Self-pay | Admitting: Family Medicine

## 2022-10-28 MED ORDER — AMPHETAMINE-DEXTROAMPHETAMINE 10 MG PO TABS: ORAL_TABLET | ORAL | 0 refills | Status: DC

## 2022-11-25 ENCOUNTER — Ambulatory Visit: Payer: No Typology Code available for payment source | Admitting: Internal Medicine

## 2022-11-25 NOTE — Progress Notes (Signed)
Erroneous encounter - please disregard.

## 2022-12-04 DIAGNOSIS — H6122 Impacted cerumen, left ear: Secondary | ICD-10-CM | POA: Diagnosis not present

## 2022-12-04 DIAGNOSIS — H61301 Acquired stenosis of right external ear canal, unspecified: Secondary | ICD-10-CM | POA: Diagnosis not present

## 2022-12-04 DIAGNOSIS — H60331 Swimmer's ear, right ear: Secondary | ICD-10-CM | POA: Diagnosis not present

## 2022-12-12 ENCOUNTER — Encounter (HOSPITAL_COMMUNITY): Payer: Self-pay

## 2022-12-12 ENCOUNTER — Emergency Department (HOSPITAL_COMMUNITY): Payer: No Typology Code available for payment source

## 2022-12-12 ENCOUNTER — Other Ambulatory Visit: Payer: Self-pay

## 2022-12-12 ENCOUNTER — Emergency Department (HOSPITAL_COMMUNITY)
Admission: EM | Admit: 2022-12-12 | Discharge: 2022-12-12 | Disposition: A | Discharge status: Home / Self Care | Payer: No Typology Code available for payment source | Attending: Emergency Medicine | Admitting: Emergency Medicine

## 2022-12-12 DIAGNOSIS — R531 Weakness: Secondary | ICD-10-CM | POA: Diagnosis not present

## 2022-12-12 DIAGNOSIS — R000 Tachycardia, unspecified: Secondary | ICD-10-CM | POA: Diagnosis not present

## 2022-12-12 DIAGNOSIS — R410 Disorientation, unspecified: Secondary | ICD-10-CM | POA: Diagnosis not present

## 2022-12-12 DIAGNOSIS — S0990XA Unspecified injury of head, initial encounter: Secondary | ICD-10-CM | POA: Diagnosis not present

## 2022-12-12 DIAGNOSIS — M25511 Pain in right shoulder: Secondary | ICD-10-CM | POA: Diagnosis not present

## 2022-12-12 DIAGNOSIS — R42 Dizziness and giddiness: Secondary | ICD-10-CM | POA: Diagnosis not present

## 2022-12-12 DIAGNOSIS — R55 Syncope and collapse: Secondary | ICD-10-CM | POA: Diagnosis not present

## 2022-12-12 DIAGNOSIS — R519 Headache, unspecified: Secondary | ICD-10-CM | POA: Diagnosis not present

## 2022-12-12 DIAGNOSIS — R5383 Other fatigue: Secondary | ICD-10-CM | POA: Diagnosis not present

## 2022-12-12 LAB — CBC WITH DIFFERENTIAL/PLATELET
Abs Immature Granulocytes: 0.05 10*3/uL (ref 0.00–0.07)
Basophils Absolute: 0 10*3/uL (ref 0.0–0.1)
Basophils Relative: 0 %
Eosinophils Absolute: 0 10*3/uL (ref 0.0–0.5)
Eosinophils Relative: 0 %
HCT: 44.6 % (ref 39.0–52.0)
Hemoglobin: 14.6 g/dL (ref 13.0–17.0)
Immature Granulocytes: 1 %
Lymphocytes Relative: 12 %
Lymphs Abs: 0.8 10*3/uL (ref 0.7–4.0)
MCH: 29.4 pg (ref 26.0–34.0)
MCHC: 32.7 g/dL (ref 30.0–36.0)
MCV: 89.7 fL (ref 80.0–100.0)
Monocytes Absolute: 0.4 10*3/uL (ref 0.1–1.0)
Monocytes Relative: 5 %
Neutro Abs: 5.7 10*3/uL (ref 1.7–7.7)
Neutrophils Relative %: 82 %
Platelets: 221 10*3/uL (ref 150–400)
RBC: 4.97 MIL/uL (ref 4.22–5.81)
RDW: 12.2 % (ref 11.5–15.5)
WBC: 7 10*3/uL (ref 4.0–10.5)
nRBC: 0 % (ref 0.0–0.2)

## 2022-12-12 LAB — COMPREHENSIVE METABOLIC PANEL
ALT: 44 U/L (ref 0–44)
AST: 32 U/L (ref 15–41)
Albumin: 4.1 g/dL (ref 3.5–5.0)
Alkaline Phosphatase: 53 U/L (ref 38–126)
Anion gap: 6 (ref 5–15)
BUN: 11 mg/dL (ref 6–20)
CO2: 27 mmol/L (ref 22–32)
Calcium: 8.5 mg/dL — ABNORMAL LOW (ref 8.9–10.3)
Chloride: 101 mmol/L (ref 98–111)
Creatinine, Ser: 0.94 mg/dL (ref 0.61–1.24)
GFR, Estimated: >60 mL/min (ref >60)
Glucose, Bld: 128 mg/dL — ABNORMAL HIGH (ref 70–99)
Potassium: 3.9 mmol/L (ref 3.5–5.1)
Sodium: 134 mmol/L — ABNORMAL LOW (ref 135–145)
Total Bilirubin: 0.7 mg/dL (ref <1.2)
Total Protein: 7 g/dL (ref 6.5–8.1)

## 2022-12-12 LAB — URINALYSIS, ROUTINE W REFLEX MICROSCOPIC
Bacteria, UA: NONE SEEN
Bilirubin Urine: NEGATIVE
Glucose, UA: NEGATIVE mg/dL
Ketones, ur: NEGATIVE mg/dL
Leukocytes,Ua: NEGATIVE
Nitrite: NEGATIVE
Protein, ur: 30 mg/dL — AB
Specific Gravity, Urine: 1.014 (ref 1.005–1.030)
pH: 6 (ref 5.0–8.0)

## 2022-12-12 MED ORDER — ACETAMINOPHEN 325 MG PO TABS
650.0000 mg | ORAL_TABLET | Freq: Once | ORAL | Status: AC
  Administered 2022-12-12: 650 mg via ORAL
  Filled 2022-12-12: qty 2

## 2022-12-12 NOTE — ED Provider Notes (Signed)
[place] EMERGENCY DEPARTMENT AT [facility] Provider Note   CSN: [id number] Arrival date & time: 12/12/22  1326     History  Chief Complaint  Patient presents with   Loss of Consciousness    Jeffrey Compton is a 42 y.o. male. He  has a past medical history of Asperger syndrome, Hyperlipidemia, and OCD (obsessive compulsive disorder).  Is here today for evaluation of possible seizure.  He states that he was at work and started feeling slightly lightheaded and woke up on the ground, feeling slightly confused and lethargic.  He states this happened about 4 5 years ago and was witnessed by family member he had some convulsion like activity at that time.  He states he never followed back up with neurology is not on any seizure medications.  States his biological mother has a history of seizures.  He states today's episode seems similar.    Have any chest pain or shortness of breath, no fevers or chills, no neck stiffness, no nausea or vomiting.  He does admit to a mild headache.  He states he still feels very tired.  He did not have loss of control of his bowels or bladder.  Patient not sure how long the episode lasted, it was unwitnessed but he was found by coworkers who then called EMS.    Loss of Consciousness      Home Medications Prior to Admission medications   Medication Sig Start Date End Date Taking? Authorizing Provider  amphetamine-dextroamphetamine (ADDERALL) 10 MG tablet 1 qam prn for attention and focus 10/28/22   Babs Sciara, MD  DULoxetine (CYMBALTA) 20 MG capsule Take 1 capsule (20 mg total) by mouth daily. 10/21/22   Babs Sciara, MD      Allergies    Celexa [citalopram hydrobromide] and Keflex [cephalexin]    Review of Systems   Review of Systems  Cardiovascular:  Positive for syncope.    Physical Exam Updated Vital Signs BP (!) 135/90 (BP Location: Right Arm)   Pulse (!) 109   Temp 98.8 F (37.1 C) (Oral)   Resp 16   Ht 5\' 10"   (1.778 m)   Wt 104.3 kg   SpO2 100%   BMI 33.00 kg/m  Physical Exam Vitals and nursing note reviewed.  Constitutional:      General: He is not in acute distress.    Appearance: He is well-developed.  HENT:     Head: Normocephalic and atraumatic.     Nose: Nose normal.     Mouth/Throat:     Mouth: Mucous membranes are moist.  Eyes:     Conjunctiva/sclera: Conjunctivae normal.  Cardiovascular:     Rate and Rhythm: Normal rate and regular rhythm.     Heart sounds: No murmur heard. Pulmonary:     Effort: Pulmonary effort is normal. No respiratory distress.     Breath sounds: Normal breath sounds.  Abdominal:     Palpations: Abdomen is soft.     Tenderness: There is no abdominal tenderness.  Musculoskeletal:        General: No swelling.     Cervical back: Normal range of motion and neck supple.  Skin:    General: Skin is warm and dry.     Capillary Refill: Capillary refill takes less than 2 seconds.  Neurological:     General: No focal deficit present.     Mental Status: He is alert and oriented to person, place, and time. Mental status is at baseline.  Sensory: No sensory deficit.     Motor: No weakness.     Coordination: Coordination normal.     Gait: Gait normal.  Psychiatric:        Mood and Affect: Mood normal.     ED Results / Procedures / Treatments   Labs (all labs ordered are listed, but only abnormal results are displayed) Labs Reviewed  COMPREHENSIVE METABOLIC PANEL - Abnormal; Notable for the following components:      Result Value   Sodium 134 (*)    Glucose, Bld 128 (*)    Calcium 8.5 (*)    All other components within normal limits  CBC WITH DIFFERENTIAL/PLATELET    EKG EKG Interpretation Date/Time:  Friday December 12 2022 13:51:45 EST Ventricular Rate:  107 PR Interval:  158 QRS Duration:  88 QT Interval:  318 QTC Calculation: 424 R Axis:   21  Text Interpretation: Sinus tachycardia Otherwise normal ECG When compared with ECG of  17-Feb-2018 17:10, No significant change since last tracing Confirmed by Meridee Score [id number]) on 12/12/2022 2:31:48 PM  Radiology DG Shoulder Right  Result Date: 12/12/2022 CLINICAL DATA:  Right shoulder pain after fall. EXAM: RIGHT SHOULDER - 2+ VIEW COMPARISON:  January 10, 2010. FINDINGS: There is no evidence of fracture or dislocation. There is no evidence of arthropathy or other focal bone abnormality. Soft tissues are unremarkable. IMPRESSION: Negative. Electronically Signed   By: Lupita Raider M.D.   On: 12/12/2022 14:57    Procedures Procedures    Medications Ordered in ED Medications - No data to display  ED Course/ Medical Decision Making/ A&P Clinical Course as of 12/12/22 2157  Caleen Essex Dec 12, 2022  2130 Was seen today for an episode of loss of consciousness, he states he had this once in the past and it was felt to be a seizure at that time was witnessed initially today's episode was not witnessed.  He did not have any palpitations or chest pain, EKG is reassuring, no shortness of breath, vitals have been normal.  Labs and urinalysis are normal, CT of his head is normal as well.  We discussed it is hard to tell if this was seizure or syncope.  He does not have any significant risk factors for cardiac syncope.  He is in the low risk group for serious outcome on the [place] syncope rule.  Discussed with ED attending, do not feel admission is needed at this time but he is instructed if he has another episode or has any other new or worsening symptoms he to come back to the ER right away.  Discussed seizure precautions follow-up with PCP and neurology.  He is aware that he is not to drive until cleared by neurology [CB]    Clinical Course User Index [CB] Ma Rings, PA-C                                 Medical Decision Making This patient presents to the ED for concern of loss of consciousness, this involves an extensive number of treatment options, and is a  complaint that carries with it a high risk of complications and morbidity.  The differential diagnosis includes, seizure, syncope, arrhythmia, near syncope, other   Co morbidities that complicate the patient evaluation :   Asperger syndrome, hyperlipidemia, OCD   Additional history obtained:  Additional history obtained from EMR External records from outside source obtained and reviewed including  prior notes, prior labs   Lab Tests:  I Ordered, and personally interpreted labs.  The pertinent results include: Normal glucose, electrolytes reassuring, normal CBC   Imaging Studies ordered:  I ordered imaging studies including CT head which shows no intracranial hemorrhage or mass effect, x-ray right shoulder shows no acute findings no bony abnormalities I independently visualized and interpreted imaging within scope of identifying emergent findings  I agree with the radiologist interpretation   Cardiac Monitoring: / EKG:  The patient was maintained on a cardiac monitor.  I personally viewed and interpreted the cardiac monitored which showed an underlying rhythm of: Sinus rhythm   Consultations Obtained:  Discussed with Dr. Deretha Emory while in the ED.  Plan is for close outpatient follow-up with PCP and neurology for possible patient seizure workup, will give seizure precautions and strict return precautions  Problem List / ED Course / Critical interventions / Medication management  Patient was assessed today for loss of consciousness, feels like it might have been a seizure.  States he had 1 in the past.  Today's episodes not but notes, he had prodromal some mild dizziness, feels back to his usual self but did have some mild confusion, no chest pain or shortness of breath, EKG is unchanged.  Blood suspicion for cardiac syncope, prior episode reviewed in ED chart, was diagnosed syncope at that time as well so unsure of he truly ever had a seizure and he will need neurology follow-up as  he has never had this.  I have reviewed the patients home medicines and have made adjustments as needed      Amount and/or Complexity of Data Reviewed Labs: ordered. Radiology: ordered. ECG/medicine tests: ordered.  Risk OTC drugs.           Final Clinical Impression(s) / ED Diagnoses Final diagnoses:  None    Rx / DC Orders ED Discharge Orders     None         Josem Kaufmann 12/12/22 2157    Vanetta Mulders, MD 12/16/22 [phone]

## 2022-12-12 NOTE — ED Triage Notes (Signed)
Pt brought to ED by EMS  Pt was outside going to cut wood Pt found laying on ground by passer by  Pt denies not remember passing out Has happened once before, unknown cause  EMS BGL 89 EMS vital 130/73 HR 129 94% RA

## 2022-12-12 NOTE — Discharge Instructions (Signed)
It was a pleasure taking care of you today.  You are seen for an episode of loss of consciousness.  When these episodes are not witnessed it is sometimes difficult to discriminate between possible seizure versus syncope which is "passing out" we recommend you follow-up with your PCP on Monday and I have placed a referral for neurology as well so they can do further testing for your possible seizure concern given your seizure in the past and feeling similar today.  If you have another episode or if you are feeling palpitations, chest pain or shortness of breath, or have any other new or worrisome changes you to come back to the ER right away.  As discussed no driving until cleared by neurology I am including formal seizure precautions for you to follow until this is been further evaluated.  Per [facility] statutes, patients with seizures are not allowed to drive until they have been seizure-free for six months.  Other recommendations include using caution when using heavy equipment or power tools. Avoid working on ladders or at heights. Take showers instead of baths.  Do not swim alone.  Ensure the water temperature is not too high on the home water heater. Do not go swimming alone. Do not lock yourself in a room alone (i.e. bathroom). When caring for infants or small children, sit down when holding, feeding, or changing them to minimize risk of injury to the child in the event you have a seizure. Maintain good sleep hygiene. Avoid alcohol.  Also recommend adequate sleep, hydration, good diet and minimize stress.     During the Seizure   - First, ensure adequate ventilation and place patients on the floor on their left side  Loosen clothing around the neck and ensure the airway is patent. If the patient is clenching the teeth, do not force the mouth open with any object as this can cause severe damage - Remove all items from the surrounding that can be hazardous. The patient may be oblivious to  what's happening and may not even know what he or she is doing. If the patient is confused and wandering, either gently guide him/her away and block access to outside areas - Reassure the individual and be comforting - Call 911. In most cases, the seizure ends before EMS arrives. However, there are cases when seizures may last over 3 to 5 minutes. Or the individual may have developed breathing difficulties or severe injuries. If a pregnant patient or a person with diabetes develops a seizure, it is prudent to call an ambulance. - Finally, if the patient does not regain full consciousness, then call EMS. Most patients will remain confused for about 45 to 90 minutes after a seizure, so you must use judgment in calling for help. - Avoid restraints but make sure the patient is in a bed with padded side rails - Place the individual in a lateral position with the neck slightly flexed; this will help the saliva drain from the mouth and prevent the tongue from falling backward - Remove all nearby furniture and other hazards from the area - Provide verbal assurance as the individual is regaining consciousness - Provide the patient with privacy if possible - Call for help and start treatment as ordered by the caregiver   After the Seizure (Postictal Stage)   After a seizure, most patients experience confusion, fatigue, muscle pain and/or a headache. Thus, one should permit the individual to sleep. For the next few days, reassurance is essential. Being  calm and helping reorient the person is also of importance.   Most seizures are painless and end spontaneously. Seizures are not harmful to others but can lead to complications such as stress on the lungs, brain and the heart. Individuals with prior lung problems may develop labored breathing and respiratory distress.

## 2022-12-12 NOTE — ED Triage Notes (Signed)
Pt complains of weakness, HA, RIGHT shoulder pain and nausea

## 2022-12-15 ENCOUNTER — Ambulatory Visit (INDEPENDENT_AMBULATORY_CARE_PROVIDER_SITE_OTHER): Payer: No Typology Code available for payment source | Admitting: Family Medicine

## 2022-12-15 ENCOUNTER — Encounter: Payer: Self-pay | Admitting: Neurology

## 2022-12-15 ENCOUNTER — Ambulatory Visit: Payer: No Typology Code available for payment source | Admitting: Family Medicine

## 2022-12-15 VITALS — BP 137/90 | HR 104 | Temp 98.2°F | Ht 70.0 in | Wt 235.8 lb

## 2022-12-15 DIAGNOSIS — R55 Syncope and collapse: Secondary | ICD-10-CM | POA: Insufficient documentation

## 2022-12-15 NOTE — Progress Notes (Signed)
Subjective:  Patient ID: Jeffrey Compton, male    DOB: 23-Dec-1980  Age: 42 y.o. MRN: [id number]  CC:  ED follow up   HPI:  42 year old male presents for ED follow up.  Patient was seen in the ED on 11/22. Presented after been found down at work. Patient recalls preceding lightheadedness but does not recall any other symptoms. Was found down by a coworker.  He states that when he awoke he felt tired and slightly confused. There was not witness to the event (other than finding him on the ground).  Unsure of seizure like activity. He does not recall preceding chest pain or palpitations.  No other reported symptoms after.  There was no loss of bowel or bladder.  Patient believes that he may have bitten his tongue.  He was evaluated.  He had a negative workup in the ER (labs, EKG, CT head).  He has been scheduled for outpatient neurology follow-up.  He states that overall he is feeling okay.  He does feel sore from the fall.  Patient Active Problem List   Diagnosis Date Noted   Syncope 12/15/2022   Attention deficit disorder (ADD) without hyperactivity 06/14/2022   GAD (generalized anxiety disorder) 06/14/2022   Social phobia 08/04/2016   Multiple benign nevi 12/15/2011   Seborrheic dermatitis 12/15/2011    Social Hx   Social History   Socioeconomic History   Marital status: Single    Spouse name: Not on file   Number of children: Not on file   Years of education: Not on file   Highest education level: Not on file  Occupational History   Not on file  Tobacco Use   Smoking status: Never   Smokeless tobacco: Never  Vaping Use   Vaping status: Never Used  Substance and Sexual Activity   Alcohol use: Not Currently    Comment: occasional   Drug use: No   Sexual activity: Not on file  Other Topics Concern   Not on file  Social History Narrative   Not on file   Social Determinants of Health   Financial Resource Strain: Low Risk  (05/09/2022)   Overall Financial Resource  Strain (CARDIA)    Difficulty of Paying Living Expenses: Not hard at all  Food Insecurity: Low Risk  (12/04/2022)   Received from [facility]   Hunger Vital Sign    Worried About Running Out of Food in the Last Year: Never true    Ran Out of Food in the Last Year: Never true  Transportation Needs: No Transportation Needs (12/04/2022)   Received from [facility]    In the past 12 months, has lack of reliable transportation kept you from medical appointments, meetings, work or from getting things needed for daily living? : No  Physical Activity: Sufficiently Active (05/09/2022)   Exercise Vital Sign    Days of Exercise per Week: 5 days    Minutes of Exercise per Session: 60 min  Stress: No Stress Concern Present (05/09/2022)   [employer] of Occupational Health - Occupational Stress Questionnaire    Feeling of Stress : Only a little  Social Connections: Socially Isolated (05/09/2022)   Social Connection and Isolation Panel [NHANES]    Frequency of Communication with Friends and Family: More than three times a week    Frequency of Social Gatherings with Friends and Family: More than three times a week    Attends Religious Services: Never    Active Member of  Clubs or Organizations: No    Attends Engineer, structural: Never    Marital Status: Never married    Review of Systems Per HPI  Objective:  BP (!) 137/90   Pulse (!) 104   Temp 98.2 F (36.8 C)   Ht 5\' 10"  (1.778 m)   Wt 235 lb 12.8 oz (107 kg)   SpO2 97%   BMI 33.83 kg/m      12/15/2022    3:56 PM 12/12/2022    7:15 PM 12/12/2022    6:08 PM  BP/Weight  Systolic BP 137 125 121  Diastolic BP 90 88 75  Wt. (Lbs) 235.8    BMI 33.83 kg/m2      Physical Exam Vitals and nursing note reviewed.  Constitutional:      General: He is not in acute distress. HENT:     Head: Normocephalic and atraumatic.  Cardiovascular:     Rate and Rhythm: Normal rate and regular rhythm.  Pulmonary:      Effort: Pulmonary effort is normal.     Breath sounds: Normal breath sounds. No wheezing, rhonchi or rales.  Neurological:     General: No focal deficit present.     Mental Status: He is alert.  Psychiatric:     Comments: Flat affect.     Lab Results  Component Value Date   WBC 7.0 12/12/2022   HGB 14.6 12/12/2022   HCT 44.6 12/12/2022   PLT 221 12/12/2022   GLUCOSE 128 (H) 12/12/2022   CHOL 218 (H) 02/22/2018   TRIG 256 (H) 02/22/2018   HDL 27 (L) 02/22/2018   LDLCALC 140 (H) 02/22/2018   ALT 44 12/12/2022   AST 32 12/12/2022   NA 134 (L) 12/12/2022   K 3.9 12/12/2022   CL 101 12/12/2022   CREATININE 0.94 12/12/2022   BUN 11 12/12/2022   CO2 27 12/12/2022   TSH 1.560 09/19/2022     Assessment & Plan:   Problem List Items Addressed This Visit       Other   Syncope - Primary    Syncope versus seizure.  Has an appointment scheduled with neurology.  Referring to cardiology for evaluation as well.  Needs Zio patch.  Will discuss return to work and return to driving with PCP.       Relevant Orders   Ambulatory referral to Cardiology    Follow-up:  Pending discussion with PCP  Everlene Other DO [facility]

## 2022-12-15 NOTE — Patient Instructions (Signed)
Referral placed to cardiology as well.  Stay hydrated.   I will talk to [place] about work.  Take care  Dr. Adriana Simas

## 2022-12-15 NOTE — Assessment & Plan Note (Signed)
Syncope versus seizure.  Has an appointment scheduled with neurology.  Referring to cardiology for evaluation as well.  Needs Zio patch.  Will discuss return to work and return to driving with PCP.

## 2022-12-16 ENCOUNTER — Telehealth: Payer: Self-pay

## 2022-12-16 NOTE — Telephone Encounter (Signed)
Did call patient to inform per drs recommendations noted in telephone message, mailbox is full , will try call again later.

## 2022-12-16 NOTE — Telephone Encounter (Signed)
-----   Message from Jeffrey Compton sent at 12/16/2022  8:21 AM EST ----- Please advise patient that I spoke with [name] and we agree that he is okay to drive and work. He is going to see if EEG can be done before neurology appt. We suspect that this was vasovagal syncope.  Everlene Other DO [facility]

## 2022-12-16 NOTE — Telephone Encounter (Signed)
Was able to speak with patient and informed him per drs notes, he states he will need documentation in order to return to work, he will be submitting this paperwork.

## 2022-12-17 DIAGNOSIS — H61301 Acquired stenosis of right external ear canal, unspecified: Secondary | ICD-10-CM | POA: Diagnosis not present

## 2022-12-17 DIAGNOSIS — H60331 Swimmer's ear, right ear: Secondary | ICD-10-CM | POA: Diagnosis not present

## 2022-12-17 DIAGNOSIS — H6123 Impacted cerumen, bilateral: Secondary | ICD-10-CM | POA: Diagnosis not present

## 2022-12-24 ENCOUNTER — Encounter: Payer: Self-pay | Admitting: Family Medicine

## 2022-12-24 ENCOUNTER — Ambulatory Visit: Payer: No Typology Code available for payment source | Admitting: Family Medicine

## 2022-12-24 VITALS — BP 120/78 | HR 96 | Temp 98.1°F | Ht 70.0 in | Wt 235.0 lb

## 2022-12-24 DIAGNOSIS — R55 Syncope and collapse: Secondary | ICD-10-CM | POA: Diagnosis not present

## 2022-12-27 ENCOUNTER — Other Ambulatory Visit: Payer: Self-pay | Admitting: Family Medicine

## 2022-12-27 ENCOUNTER — Encounter: Payer: Self-pay | Admitting: Family Medicine

## 2022-12-27 NOTE — Progress Notes (Signed)
   Subjective:    Patient ID: Jeffrey Compton, male    DOB: Jul 05, 1980, 42 y.o.   MRN: [id number]  HPI  Patient relates he had a spell at work he stated he started feeling dizzy and lightheaded and then he passed out.  He denied any chest tightness pressure pain Denied urinating or defecating on himself he states he was able to come to Then he states he started feeling bad again lightheaded to get up passed out and then he stated someone called an ambulance for him and he went to the ER He is scheduled to see cardiology coming up and is scheduled to see neurology Patient denies a history of seizures It is possible this could be an arrhythmia issue There is a remote possibility this could be a seizure related issue I do think seeing both specialist would be wise I would recommend the patient take it easy this week no work until he sees cardiology Limited driving local only   Review of Systems     Objective:   Physical Exam  General-in no acute distress Eyes-no discharge Lungs-respiratory rate normal, CTA CV-no murmurs,RRR Extremities skin warm dry no edema Neuro grossly normal Behavior normal, alert       Assessment & Plan:  Hold off on driving currently See neurology Have appropriate workup Follow-up if any ongoing troubles Have reached out to neurology to see if they can see him sooner I have also encouraged the patient to get on their cancellation list

## 2022-12-30 DIAGNOSIS — H5203 Hypermetropia, bilateral: Secondary | ICD-10-CM | POA: Diagnosis not present

## 2023-01-02 DIAGNOSIS — H52223 Regular astigmatism, bilateral: Secondary | ICD-10-CM | POA: Diagnosis not present

## 2023-01-02 DIAGNOSIS — H5203 Hypermetropia, bilateral: Secondary | ICD-10-CM | POA: Diagnosis not present

## 2023-01-07 ENCOUNTER — Ambulatory Visit: Payer: No Typology Code available for payment source | Admitting: Cardiology

## 2023-01-09 ENCOUNTER — Encounter: Payer: Self-pay | Admitting: Family Medicine

## 2023-01-11 NOTE — Telephone Encounter (Signed)
Nurses I am uncertain why his appointment was changed or moved.  Unfortunately sometimes cardiology schedules change.  Please have referral coordinator send to cardiology a message that we would like for the patient to be seen sooner or at least be on a cancellation list  As for restrictions #1 I would avoid any strenuous activity such as using a chainsaw, or lifting more than 30 pounds #2 for now I would recommend no driving until neurology sees him to be certain there was not a seizure #3 I would not be out in a boat 4.  Should be able to do standard activity except for what is listed above  If there is specific concerns questions or other issues please let me know thanks-Dr. [name]

## 2023-01-19 ENCOUNTER — Encounter: Payer: Self-pay | Admitting: Neurology

## 2023-01-19 ENCOUNTER — Ambulatory Visit (INDEPENDENT_AMBULATORY_CARE_PROVIDER_SITE_OTHER): Payer: No Typology Code available for payment source | Admitting: Neurology

## 2023-01-19 VITALS — BP 123/88 | HR 88 | Ht 70.0 in | Wt 240.0 lb

## 2023-01-19 DIAGNOSIS — R55 Syncope and collapse: Secondary | ICD-10-CM | POA: Diagnosis not present

## 2023-01-19 NOTE — Progress Notes (Signed)
NEUROLOGY CONSULTATION NOTE  DUVALL GLISAN MRN: [id number] DOB: November 27, 1980  Referring provider: Carmel [place], PA-C Primary care provider: Dr. Lilyan Punt  Reason for consult:  syncope  Thank you for your kind referral of Jeffrey Compton for consultation of the above symptoms. Although his history is well known to you, please allow me to reiterate it for the purpose of our medical record. He is alone in the office today. Records and images were personally reviewed where available.   HISTORY OF PRESENT ILLNESS: This is a pleasant 42 year old left-handed man with a history of diet-controlled hyperlipidemia, OCD, Asperger syndrome, presenting for evaluation of an episode of loss of consciousness that occurred on 12/12/22. ER notes reviewed, he reported feeling slightly lightheaded then waking up on the ground feeling slightly confused and lethargic. He felt fine going to work and ate breakfast but not lunch. He was brought to the ER after 1pm. He recalls putting wood in his trailer then felt lightheaded and sat down. He felt better so he got back up and recalls putting another piece of wood in, then waking up to a coworker talking to him to get up. He could talk but felt kind of confused, he said Felix Pacini was the president. No tongue bite, incontinence, or focal weakness. He thinks he was hot and does not drink as much water in the winter. He recalls feeling sore and tired in the ER, no headaches, dizziness. EMS reports BG 89, HR 129, BP 130/73. In the ER, EKG showed sinus tachycardia, bloodwork was normal. I personally reviewed head CT without contrast which did not show any acute changes. He reported 4-5 years ago, he had a witnessed convulsion-like activity at home but did not follow-up with Neurology. He does not remember this today and states the only other time he lost consciousness was at age 42 while out in the yard. There is an ER visit on 02/17/2018 for syncope. He had not eaten  much then in the afternoon felt lightheaded and a stinging in his head, then woke up on the ground. A neighbor saw him on the ground and called EMS, glucose 56. He had dislocated the left shoulder.   He denies any staring/unresponsive episodes, gaps in time, olfactory/gustatory hallucinations, focal numbness/tingling/weakness, myoclonic jerks. He denies any further feeling of lightheadedness. He recalls having some chest pain in August, none recently. No palpitations. Since the incident, he has had headaches in the back of his head with pain in the nape on the left side the past 3 days. Tylenol helps. No diplopia, dysarthria/dysphagia, back pain, bowel/bladder dysfunction. He has had some fatigue off and on for a few years, but since the incident in November, the fatigue has been constant. He gets 6-8 hours of sleep and may have been sleep deprived prior to the incident. He notes it has been a stressful year. He has been told he snores, he has occasional daytime drowsiness. His appetite has also changed. He was taking Cymbalta, which he had stopped prior to the incident. He takes Adderall twice a week. He denies any alcohol use. He lives alone. He works for the city International aid/development worker, driving and Industrial/product designer. Memory is good. He had a normal birth and early development.  There is no history of febrile convulsions, CNS infections such as meningitis/encephalitis, significant traumatic brain injury, neurosurgical procedures, or family history of seizures.    PAST MEDICAL HISTORY: Past Medical History:  Diagnosis Date   Asperger syndrome  Hyperlipidemia    OCD (obsessive compulsive disorder)     PAST SURGICAL HISTORY: History reviewed. No pertinent surgical history.  MEDICATIONS: No current outpatient medications on file prior to visit.   No current facility-administered medications on file prior to visit.    ALLERGIES: Allergies  Allergen Reactions   Celexa [Citalopram Hydrobromide]      Night sweats   Keflex [Cephalexin] Rash    FAMILY HISTORY: Family History  Problem Relation Age of Onset   Heart disease Other     SOCIAL HISTORY: Social History   Socioeconomic History   Marital status: Single    Spouse name: Not on file   Number of children: Not on file   Years of education: Not on file   Highest education level: Not on file  Occupational History   Not on file  Tobacco Use   Smoking status: Never   Smokeless tobacco: Never  Vaping Use   Vaping status: Never Used  Substance and Sexual Activity   Alcohol use: Not Currently    Comment: occasional   Drug use: No   Sexual activity: Not on file  Other Topics Concern   Not on file  Social History Narrative   Are you right handed or left handed? Left    Are you currently employed ?    What is your current occupation? Worked for the [employer] you live at home alone? no   Who lives with you? Lives with family    What type of home do you live in: 1 story or 2 story?  1 story       Social Drivers of Corporate investment banker Strain: Low Risk  (05/09/2022)   Overall Financial Resource Strain (CARDIA)    Difficulty of Paying Living Expenses: Not hard at all  Food Insecurity: Low Risk  (12/17/2022)   Received from [facility]   Hunger Vital Sign    Worried About Running Out of Food in the Last Year: Never true    Ran Out of Food in the Last Year: Never true  Transportation Needs: No Transportation Needs (12/17/2022)   Received from [facility]    In the past 12 months, has lack of reliable transportation kept you from medical appointments, meetings, work or from getting things needed for daily living? : No  Physical Activity: Sufficiently Active (05/09/2022)   Exercise Vital Sign    Days of Exercise per Week: 5 days    Minutes of Exercise per Session: 60 min  Stress: No Stress Concern Present (05/09/2022)   [employer] of Occupational Health - Occupational Stress  Questionnaire    Feeling of Stress : Only a little  Social Connections: Socially Isolated (05/09/2022)   Social Connection and Isolation Panel [NHANES]    Frequency of Communication with Friends and Family: More than three times a week    Frequency of Social Gatherings with Friends and Family: More than three times a week    Attends Religious Services: Never    Database administrator or Organizations: No    Attends Banker Meetings: Never    Marital Status: Never married  Intimate Partner Violence: Not At Risk (05/09/2022)   Humiliation, Afraid, Rape, and Kick questionnaire    Fear of Current or Ex-Partner: No    Emotionally Abused: No    Physically Abused: No    Sexually Abused: No     PHYSICAL EXAM: Vitals:   01/19/23 1005  BP:  123/88  Pulse: 88  SpO2: 98%   General: No acute distress Head:  Normocephalic/atraumatic Skin/Extremities: No rash, no edema, scar on right shin Neurological Exam: Mental status: alert and oriented to person, place, and time, no dysarthria or aphasia, Fund of knowledge is appropriate.  Recent and remote memory are intact, 3/3 delayed recall.  Attention and concentration are normal, unable to spell WORLD backwards but able to say days of week backward.  Cranial nerves: CN I: not tested CN II: pupils equal, round, visual fields intact CN III, IV, VI:  full range of motion, no nystagmus, no ptosis CN V: facial sensation intact CN VII: upper and lower face symmetric CN VIII: hearing intact to conversation Bulk & Tone: normal, no fasciculations. Motor: 5/5 throughout with no pronator drift. Sensation: intact to light touch, cold, pin, vibration sense.  No extinction to double simultaneous stimulation.  Romberg test negative Deep Tendon Reflexes: +1 both UE, +2 both LE.  Cerebellar: no incoordination on finger to nose testing Gait: narrow-based and steady, able to tandem walk adequately. Tremor: none   IMPRESSION: This is a pleasant 43  year old left-handed man with a history of diet-controlled hyperlipidemia, OCD, Asperger syndrome, presenting for evaluation of an episode of loss of consciousness that occurred on 12/12/22. On review of notes, he was in the ER in 01/2018 also after an episode of loss of consciousness in the setting of hypoglycemia. He does not remember this, but recalls another syncopal episode at age 26. Neurological exam normal, no clear epilepsy risk factors. Etiology of symptoms unclear, history suggestive of vasovagal syncope, however due to recurrent nature, recommend MRI brain with and without contrast and 1-hour EEG. He is awaiting Cardiology evaluation as well. We discussed [place] driving laws, recommend completion of workup before resuming driving/operating heavy machinery. Our office will call with results, if normal, follow-up as needed. He knows to call for any changes.   Thank you for allowing me to participate in the care of this patient. Please do not hesitate to call for any questions or concerns.   Patrcia Dolly, M.D.  CC: Dr. Gerda Diss, Carmel [place], [place]

## 2023-01-19 NOTE — Patient Instructions (Signed)
Good to meet you.  Schedule MRI brain with and without contrast  2. Schedule 1-hour EEG  3. Proceed with Cardiology evaluation  4. Our office will call with results, if normal, follow-up as needed. Call for any changes

## 2023-01-21 NOTE — Progress Notes (Signed)
 Cardiology Office Note:    Date:  01/29/2023   ID:  Jeffrey, Wedig Sep Compton, Jeffrey Compton, MRN [id number]  PCP:  Alphonsa Glendia LABOR, MD   [facility] Providers Cardiologist:  None     Referring MD: Alphonsa Glendia LABOR, MD   Chief Complaint  Patient presents with   Loss of Consciousness    History of Present Illness:    Jeffrey Compton is a 43 y.o. male seen at the request of Dr Alphonsa for evaluation of syncope. He has a history of HLD. He works for the city of [employer]. In 2020 he had an episode of syncope. States he was working in yard and next thing he knew he was in ambulance. Went to ED and evaluation there was negative. Thought he may have been hypoglycemic. No further work up then. No recurrent until the week before Thanksgiving he was taking down a tree. Felt lightheaded and sat down. Felt better and went back to work. Next thing he remembers is waking up in ambulance. No chest pain, dyspnea or palpitations. No dizziness since then. No prior cardiac history. Generally in good health.  Past Medical History:  Diagnosis Date   Asperger syndrome    Hyperlipidemia    OCD (obsessive compulsive disorder)     History reviewed. No pertinent surgical history.  Current Medications: No outpatient medications have been marked as taking for the 01/29/23 encounter (Office Visit) with Miranda Frese M, MD.     Allergies:   Celexa [citalopram hydrobromide] and Keflex [cephalexin]   Social History   Socioeconomic History   Marital status: Single    Spouse name: Not on file   Number of children: Not on file   Years of education: Not on file   Highest education level: Not on file  Occupational History   Not on file  Tobacco Use   Smoking status: Never   Smokeless tobacco: Never  Vaping Use   Vaping status: Never Used  Substance and Sexual Activity   Alcohol use: Not Currently    Comment: occasional   Drug use: No   Sexual activity: Not on file  Other Topics Concern   Not  on file  Social History Narrative   Are you right handed or left handed? Left    Are you currently employed ?    What is your current occupation? Worked for the [employer] you live at home alone? no   Who lives with you? Lives with family    What type of home do you live in: 1 story or 2 story?  1 story       Social Drivers of Corporate Investment Banker Strain: Low Risk  (05/09/2022)   Overall Financial Resource Strain (CARDIA)    Difficulty of Paying Living Expenses: Not hard at all  Food Insecurity: Low Risk  (01/27/2023)   Received from [facility]   Hunger Vital Sign    Worried About Running Out of Food in the Last Year: Never true    Ran Out of Food in the Last Year: Never true  Transportation Needs: No Transportation Needs (01/27/2023)   Received from [facility]    In the past Compton months, has lack of reliable transportation kept you from medical appointments, meetings, work or from getting things needed for daily living? : No  Physical Activity: Sufficiently Active (05/09/2022)   Exercise Vital Sign    Days of Exercise per Week: 5 days  Minutes of Exercise per Session: 60 min  Stress: No Stress Concern Present (05/09/2022)   [employer] of Occupational Health - Occupational Stress Questionnaire    Feeling of Stress : Only a little  Social Connections: Socially Isolated (05/09/2022)   Social Connection and Isolation Panel [NHANES]    Frequency of Communication with Friends and Family: More than three times a week    Frequency of Social Gatherings with Friends and Family: More than three times a week    Attends Religious Services: Never    Database Administrator or Organizations: No    Attends Engineer, Structural: Never    Marital Status: Never married     Family History: The patient's family history includes Heart attack in his maternal grandfather and paternal grandfather; Heart disease in an other family member.  ROS:   Please see  the history of present illness.     All other systems reviewed and are negative.  EKGs/Labs/Other Studies Reviewed:    The following studies were reviewed today:       Recent Labs: 09/19/2022: TSH 1.560 12/12/2022: ALT 44; BUN 11; Creatinine, Ser 0.94; Hemoglobin 14.6; Platelets 221; Potassium 3.9; Sodium 134  Recent Lipid Panel    Component Value Date/Time   CHOL 218 (H) 02/22/2018 0817   TRIG 256 (H) 02/22/2018 0817   HDL 27 (L) 02/22/2018 0817   CHOLHDL 8.1 (H) 02/22/2018 0817   CHOLHDL 8.1 04/06/2012 1009   VLDL 73 (H) 04/06/2012 1009   LDLCALC 140 (H) 02/22/2018 0817     Risk Assessment/Calculations:                Physical Exam:    VS:  BP 106/62 (BP Location: Left Arm, Patient Position: Sitting, Cuff Size: Large)   Pulse 89   Ht 5' 10 (1.778 m)   Wt 237 lb (107.5 kg)   SpO2 98%   BMI 34.01 kg/m     Wt Readings from Last 3 Encounters:  01/29/23 237 lb (107.5 kg)  Compton/30/24 240 lb (108.9 kg)  Compton/04/24 235 lb (106.6 kg)     GEN:  Well nourished, well developed in no acute distress HEENT: Normal NECK: No JVD; No carotid bruits LYMPHATICS: No lymphadenopathy CARDIAC: RRR, no murmurs, rubs, gallops RESPIRATORY:  Clear to auscultation without rales, wheezing or rhonchi  ABDOMEN: Soft, non-tender, non-distended MUSCULOSKELETAL:  No edema; No deformity  SKIN: Warm and dry NEUROLOGIC:  Alert and oriented x 3 PSYCHIATRIC:  Normal affect   ASSESSMENT:    1. Syncope and collapse   2. Hyperlipidemia, unspecified hyperlipidemia type    PLAN:    In order of problems listed above:  Syncope etiology unclear. Initial evaluation with Ecg and labs unremarkable. Normal QT interval. Currently being evaluated with Neuro including MRI and EEG recommend Echo and 2 week event monitor. If these studies are unremarkable would recommend watchful waiting. If he were to have recurrent syncope an implantable loop recorder would be advised. He is aware of driving  restrictions.            Medication Adjustments/Labs and Tests Ordered: Current medicines are reviewed at length with the patient today.  Concerns regarding medicines are outlined above.  No orders of the defined types were placed in this encounter.  No orders of the defined types were placed in this encounter.   There are no Patient Instructions on file for this visit.   Signed, Jame Seelig, MD  01/29/2023 3:18 PM    [facility]

## 2023-01-22 ENCOUNTER — Ambulatory Visit (INDEPENDENT_AMBULATORY_CARE_PROVIDER_SITE_OTHER): Payer: No Typology Code available for payment source | Admitting: Neurology

## 2023-01-22 DIAGNOSIS — R55 Syncope and collapse: Secondary | ICD-10-CM

## 2023-01-22 NOTE — Progress Notes (Signed)
 EEG complete - results pending

## 2023-01-27 DIAGNOSIS — H6122 Impacted cerumen, left ear: Secondary | ICD-10-CM | POA: Diagnosis not present

## 2023-01-27 DIAGNOSIS — H60331 Swimmer's ear, right ear: Secondary | ICD-10-CM | POA: Diagnosis not present

## 2023-01-27 DIAGNOSIS — H61301 Acquired stenosis of right external ear canal, unspecified: Secondary | ICD-10-CM | POA: Diagnosis not present

## 2023-01-28 ENCOUNTER — Ambulatory Visit: Payer: No Typology Code available for payment source | Admitting: Neurology

## 2023-01-29 ENCOUNTER — Encounter: Payer: Self-pay | Admitting: Cardiology

## 2023-01-29 ENCOUNTER — Ambulatory Visit: Payer: No Typology Code available for payment source | Attending: Cardiology | Admitting: Cardiology

## 2023-01-29 ENCOUNTER — Ambulatory Visit: Payer: No Typology Code available for payment source

## 2023-01-29 VITALS — BP 106/62 | HR 89 | Ht 70.0 in | Wt 237.0 lb

## 2023-01-29 DIAGNOSIS — R55 Syncope and collapse: Secondary | ICD-10-CM | POA: Diagnosis not present

## 2023-01-29 DIAGNOSIS — E785 Hyperlipidemia, unspecified: Secondary | ICD-10-CM

## 2023-01-29 NOTE — Addendum Note (Signed)
 Addended by: Neoma Laming on: 01/29/2023 03:25 PM   Modules accepted: Orders

## 2023-01-29 NOTE — Progress Notes (Unsigned)
 Enrolled for [employer] to mail a ZIO XT long term holter monitor to the patients address on file.

## 2023-01-29 NOTE — Patient Instructions (Signed)
 Medication Instructions:  Continue same medications *If you need a refill on your cardiac medications before your next appointment, please call your pharmacy*   Lab Work: None ordered   Testing/Procedures: Schedule Echo  first available  14 day Heart Monitor will be mailed to your home   Follow-Up: At [facility], you and your health needs are our priority.  As part of our continuing mission to provide you with exceptional heart care, we have created designated Provider Care Teams.  These Care Teams include your primary Cardiologist (physician) and Advanced Practice Providers (APPs -  Physician Assistants and Nurse Practitioners) who all work together to provide you with the care you need, when you need it.  We recommend signing up for the patient portal called MyChart.  Sign up information is provided on this After Visit Summary.  MyChart is used to connect with patients for Virtual Visits (Telemedicine).  Patients are able to view lab/test results, encounter notes, upcoming appointments, etc.  Non-urgent messages can be sent to your provider as well.   To learn more about what you can do with MyChart, go to [email or web address].    Your next appointment:  To Be Determined after test    Provider:  Dr.Jordan

## 2023-02-01 NOTE — Addendum Note (Signed)
 Addended by: Van Clines on: 02/01/2023 03:51 PM   Modules accepted: Orders

## 2023-02-01 NOTE — Procedures (Signed)
 ELECTROENCEPHALOGRAM REPORT  Date of Study: 01/22/2023  Patient's Name: Jeffrey Compton MRN: [id number] Date of Birth: August 10, 1980  Referring Provider: Dr. Darice Shivers  Clinical History: This is a 43 year old man with recurrent syncope. EEG for classification.  Medications: none  Technical Summary: A multichannel digital 1-hour EEG recording measured by the international 10-20 system with electrodes applied with paste and impedances below 5000 ohms performed in our laboratory with EKG monitoring in an awake and asleep patient.  Hyperventilation and photic stimulation were performed.  The digital EEG was referentially recorded, reformatted, and digitally filtered in a variety of bipolar and referential montages for optimal display.    Description: The patient is awake and asleep during the recording.  During maximal wakefulness, there is a symmetric, medium voltage 10.5-11 Hz posterior dominant rhythm that attenuates with eye opening.  The record is symmetric.  During drowsiness and stage I sleep, there is an increase in theta slowing of the background with rare vertex waves seen. Hyperventilation and photic stimulation did not elicit any abnormalities.  There were no epileptiform discharges or electrographic seizures seen.    EKG lead was unremarkable.  Impression: This 1-hour awake and asleep EEG is normal.    Clinical Correlation: A normal EEG does not exclude a clinical diagnosis of epilepsy.  If further clinical questions remain, prolonged EEG may be helpful.  Clinical correlation is advised.   Darice Shivers, M.D.

## 2023-02-03 ENCOUNTER — Encounter: Payer: Self-pay | Admitting: Neurology

## 2023-02-10 ENCOUNTER — Ambulatory Visit
Admission: RE | Admit: 2023-02-10 | Discharge: 2023-02-10 | Disposition: A | Discharge status: Home / Self Care | Payer: No Typology Code available for payment source | Source: Ambulatory Visit | Attending: Neurology | Admitting: Neurology

## 2023-02-10 DIAGNOSIS — R55 Syncope and collapse: Secondary | ICD-10-CM | POA: Diagnosis not present

## 2023-02-10 DIAGNOSIS — R569 Unspecified convulsions: Secondary | ICD-10-CM | POA: Diagnosis not present

## 2023-02-10 MED ORDER — GADOPICLENOL 0.5 MMOL/ML IV SOLN
10.0000 mL | Freq: Once | INTRAVENOUS | Status: AC | PRN
  Administered 2023-02-10: 10 mL via INTRAVENOUS

## 2023-02-18 ENCOUNTER — Ambulatory Visit (HOSPITAL_COMMUNITY): Payer: No Typology Code available for payment source | Attending: Cardiology

## 2023-02-18 DIAGNOSIS — R55 Syncope and collapse: Secondary | ICD-10-CM | POA: Insufficient documentation

## 2023-02-18 DIAGNOSIS — E785 Hyperlipidemia, unspecified: Secondary | ICD-10-CM | POA: Insufficient documentation

## 2023-02-18 LAB — ECHOCARDIOGRAM COMPLETE
Area-P 1/2: 2.91 cm2
Calc EF: 57.7 %
S' Lateral: 3.83 cm
Single Plane A2C EF: 58.6 %
Single Plane A4C EF: 56 %

## 2023-02-23 ENCOUNTER — Ambulatory Visit: Payer: No Typology Code available for payment source | Admitting: Family Medicine

## 2023-02-24 ENCOUNTER — Telehealth: Payer: Self-pay

## 2023-02-24 NOTE — Telephone Encounter (Signed)
Pt come by office and wanted Dr [name] to look over his MRI and reach out to him regarding the results

## 2023-02-24 NOTE — Telephone Encounter (Signed)
MRI did not show any problems.  EEG was normal. Per medical record it does appear that neurology tried a few times to reach out to him but did not get an answer Patient does have a follow-up visit in March please keep

## 2023-02-27 NOTE — Telephone Encounter (Signed)
 Left a message for the patient to return the call to inform per drs notes and recommendations.

## 2023-03-02 ENCOUNTER — Encounter: Payer: Self-pay | Admitting: Family Medicine

## 2023-03-02 ENCOUNTER — Ambulatory Visit: Payer: No Typology Code available for payment source | Admitting: Family Medicine

## 2023-03-02 VITALS — BP 124/82 | HR 80 | Temp 97.2°F | Ht 70.0 in | Wt 237.0 lb

## 2023-03-02 DIAGNOSIS — S29012D Strain of muscle and tendon of back wall of thorax, subsequent encounter: Secondary | ICD-10-CM

## 2023-03-02 NOTE — Progress Notes (Signed)
   Subjective:    Patient ID: Jeffrey Compton, male    DOB: 10-26-1980, 43 y.o.   MRN: [id number]  HPI Patient relates overall he has been feeling well Denies any major setbacks States he has not had any falling out spells Denies any numbness tingling or discomfort in his neck arms legs He does relate rhomboid discomfort on the left side Describes it more as a aching sensation that hurts with certain movements this is been present since his injury in the fall time   He has not been able to work over the past several months because he has been undergoing evaluation by cardiology and neurology.  His echo came back looking good.  EEG came back looking good.  An MRI was good as well he denies any seizures    Review of Systems     Objective:   Physical Exam  General-in no acute distress Eyes-no discharge Lungs-respiratory rate normal, CTA CV-no murmurs,RRR Extremities skin warm dry no edema Neuro grossly normal Behavior normal, alert Tenderness in the left rhomboid region      Assessment & Plan:  Rhomboid region tenderness-stretches were shown May use anti-inflammatories 2 tablets of Advil 3 times daily for the next 7 days follow-up if progressive troubles Warning signs discussed in detail No sign of any significant nerve impingement If persistent troubles may need further imaging  He has not had any neurologic spells he states he has been doing well he would like to be able to return to work he states he is uncertain if he is cleared to go back to work We will touch base with neurology-message was sent to the provider Then we will go from there  Patient was informed if he has any unusual falling out or passing out spells further testing would be warranted   Patient does relate he is stressed but denies being depressed does not want to be on medication states he is not suicidal

## 2023-03-03 ENCOUNTER — Ambulatory Visit: Payer: No Typology Code available for payment source | Admitting: Cardiovascular Disease

## 2023-03-03 DIAGNOSIS — H60331 Swimmer's ear, right ear: Secondary | ICD-10-CM | POA: Diagnosis not present

## 2023-03-03 DIAGNOSIS — H61301 Acquired stenosis of right external ear canal, unspecified: Secondary | ICD-10-CM | POA: Diagnosis not present

## 2023-03-03 DIAGNOSIS — H6122 Impacted cerumen, left ear: Secondary | ICD-10-CM | POA: Diagnosis not present

## 2023-03-04 NOTE — Telephone Encounter (Signed)
Please let him know that I did communicate with the neurologist.  They stated that they would not recommend any additional test as long as he does not have any reoccurrence But they did state under [place] law he should not be driving or operating heavy machinery for 6 months after the spell that he had  So based on that he would be able to return to work but it would not be advisable for him to drive or operate heavy machinery or dangerous machinery Please talk with the patient and let him know that I can do a letter to this effect and have it for him Thursday afternoon or Friday whenever he chooses to come by to pick it up He will need to talk with his workplace to see if they would allow him to return to work with those type of restrictions thank you

## 2023-03-04 NOTE — Telephone Encounter (Signed)
Called pt and he stated he went over the results with Dr Gerda Diss on Monday but he wants to know if he is able to go back to work if so pt is needing letter stating this

## 2023-03-05 ENCOUNTER — Telehealth: Payer: Self-pay | Admitting: Neurology

## 2023-03-05 NOTE — Telephone Encounter (Signed)
Pt called in stating he desperately needs to be able to go back to work. 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 He thought DR. Karel Jarvis was supposed to let Dr. Gerda Diss know it is ok for him to go back to work so Dr. Gerda Diss can write something for him to go back.

## 2023-03-05 NOTE — Telephone Encounter (Signed)
Pls let him know that I have communicated with his cardiologist Dr. [place] and with Dr. Gerda Diss as well. His cardiac monitor results are not yet back, but unfortunately that is the [place] law of no driving for 6 months following a passing out episode if no cause is identified. We can write a letter requesting for accommodations at work until 6 months is up.

## 2023-03-06 NOTE — Telephone Encounter (Signed)
Pt called left a voice mail to call the office back  ?

## 2023-03-09 NOTE — Telephone Encounter (Signed)
Pt called no answer my chart message sent to pt

## 2023-03-13 ENCOUNTER — Ambulatory Visit: Payer: No Typology Code available for payment source | Admitting: Family Medicine

## 2023-03-27 ENCOUNTER — Ambulatory Visit: Payer: No Typology Code available for payment source | Admitting: Family Medicine

## 2023-03-30 ENCOUNTER — Ambulatory Visit: Payer: Self-pay | Admitting: Family Medicine

## 2023-03-30 ENCOUNTER — Ambulatory Visit: Payer: Self-pay

## 2023-03-30 NOTE — Telephone Encounter (Signed)
 Noted.

## 2023-03-30 NOTE — Telephone Encounter (Signed)
  Chief Complaint: Puncture Wound to right foot Symptoms: Redness, swelling and Pain Frequency: occurred on Friday Pertinent Negatives: Patient denies Fever Disposition: [] ED /[x] Urgent Care (no appt availability in office) / [] Appointment(In office/virtual)/ []  [facility]/ [] Home Care/ [] Refused Recommended Disposition /[] [facility]/ []  Follow-up with PCP Additional Notes: patient's mother called for patient-patient stepped on a nail on Friday. Puncture wound to pad of foot towards the outside of right foot. Redness, swelling and pain are present. Patient has been soaking his foot in epsom salts and using neosporin with no change to symptoms. Per protocol, patient is recommended to be seen today. No appointments available with PCP until tomorrow. Appointment made for [facility] in [place] for today. Patient's mother verbalized understanding of plan and all questions answered.    Copied from CRM [id number]. Topic: Clinical - Red Word Triage >> Mar 30, 2023 10:30 AM Marlow Baars wrote: Red Word that prompted transfer to Nurse Triage: The mother of the patient who is on the Dpr list called in stating the patient stepped on a nail on Friday and even after soaking his foot in epsom salts and using neosporin it continues to be red and puffy and be hurting him pretty good. With that I will transfer her to [facility]. Reason for Disposition  [1] Puncture wound of bare foot (no shoes) AND [2] setting was dirty  (Exception: Shallow puncture.)  Answer Assessment - Initial Assessment Questions 1. LOCATION: "Where is the puncture located?"      Pad of right foot towards the outside of the foot 2. OBJECT: "What was the object that punctured the skin?"      Nail-from wound 3. DEPTH: "How deep do you think the puncture goes?"      Unsure how far it went in 4. ONSET: "When did the injury occur?" (Minutes or hours)     Friday 5. PAIN: "Is it painful?" If Yes, ask: "How bad is the pain?"   (Scale 1-10; or mild, moderate, severe)     Pain 6-7 out of 10 6. TETANUS: "When was the last tetanus booster?"     2007 according to patient  Protocols used: Puncture Wound-A-AH

## 2023-03-31 DIAGNOSIS — H61301 Acquired stenosis of right external ear canal, unspecified: Secondary | ICD-10-CM | POA: Diagnosis not present

## 2023-03-31 DIAGNOSIS — H60331 Swimmer's ear, right ear: Secondary | ICD-10-CM | POA: Diagnosis not present

## 2023-03-31 DIAGNOSIS — H6122 Impacted cerumen, left ear: Secondary | ICD-10-CM | POA: Diagnosis not present

## 2023-04-01 ENCOUNTER — Ambulatory Visit: Payer: No Typology Code available for payment source | Admitting: Family Medicine

## 2023-05-01 DIAGNOSIS — H6122 Impacted cerumen, left ear: Secondary | ICD-10-CM | POA: Diagnosis not present

## 2023-05-13 DIAGNOSIS — H60331 Swimmer's ear, right ear: Secondary | ICD-10-CM | POA: Diagnosis not present

## 2023-05-13 DIAGNOSIS — H61301 Acquired stenosis of right external ear canal, unspecified: Secondary | ICD-10-CM | POA: Diagnosis not present

## 2023-05-15 ENCOUNTER — Ambulatory Visit: Payer: No Typology Code available for payment source

## 2023-06-20 ENCOUNTER — Emergency Department (HOSPITAL_COMMUNITY)
Admission: EM | Admit: 2023-06-20 | Discharge: 2023-06-20 | Disposition: A | Discharge status: Home / Self Care | Attending: Emergency Medicine | Admitting: Emergency Medicine

## 2023-06-20 ENCOUNTER — Emergency Department (HOSPITAL_COMMUNITY)

## 2023-06-20 ENCOUNTER — Other Ambulatory Visit: Payer: Self-pay

## 2023-06-20 DIAGNOSIS — W19XXXA Unspecified fall, initial encounter: Secondary | ICD-10-CM | POA: Insufficient documentation

## 2023-06-20 DIAGNOSIS — R569 Unspecified convulsions: Secondary | ICD-10-CM | POA: Insufficient documentation

## 2023-06-20 DIAGNOSIS — M25511 Pain in right shoulder: Secondary | ICD-10-CM | POA: Diagnosis not present

## 2023-06-20 DIAGNOSIS — R519 Headache, unspecified: Secondary | ICD-10-CM | POA: Insufficient documentation

## 2023-06-20 DIAGNOSIS — R410 Disorientation, unspecified: Secondary | ICD-10-CM | POA: Diagnosis not present

## 2023-06-20 DIAGNOSIS — S40011A Contusion of right shoulder, initial encounter: Secondary | ICD-10-CM

## 2023-06-20 DIAGNOSIS — R000 Tachycardia, unspecified: Secondary | ICD-10-CM | POA: Diagnosis not present

## 2023-06-20 DIAGNOSIS — M25519 Pain in unspecified shoulder: Secondary | ICD-10-CM | POA: Diagnosis not present

## 2023-06-20 LAB — CBC WITH DIFFERENTIAL/PLATELET
Abs Immature Granulocytes: 0.02 10*3/uL (ref 0.00–0.07)
Basophils Absolute: 0 10*3/uL (ref 0.0–0.1)
Basophils Relative: 1 %
Eosinophils Absolute: 0.1 10*3/uL (ref 0.0–0.5)
Eosinophils Relative: 1 %
HCT: 42.9 % (ref 39.0–52.0)
Hemoglobin: 15 g/dL (ref 13.0–17.0)
Immature Granulocytes: 0 %
Lymphocytes Relative: 25 %
Lymphs Abs: 1.4 10*3/uL (ref 0.7–4.0)
MCH: 30.8 pg (ref 26.0–34.0)
MCHC: 35 g/dL (ref 30.0–36.0)
MCV: 88.1 fL (ref 80.0–100.0)
Monocytes Absolute: 0.5 10*3/uL (ref 0.1–1.0)
Monocytes Relative: 9 %
Neutro Abs: 3.4 10*3/uL (ref 1.7–7.7)
Neutrophils Relative %: 64 %
Platelets: 251 10*3/uL (ref 150–400)
RBC: 4.87 MIL/uL (ref 4.22–5.81)
RDW: 12.1 % (ref 11.5–15.5)
WBC: 5.4 10*3/uL (ref 4.0–10.5)
nRBC: 0 % (ref 0.0–0.2)

## 2023-06-20 LAB — COMPREHENSIVE METABOLIC PANEL WITH GFR
ALT: 36 U/L (ref 0–44)
AST: 30 U/L (ref 15–41)
Albumin: 4.1 g/dL (ref 3.5–5.0)
Alkaline Phosphatase: 57 U/L (ref 38–126)
Anion gap: 10 (ref 5–15)
BUN: 13 mg/dL (ref 6–20)
CO2: 21 mmol/L — ABNORMAL LOW (ref 22–32)
Calcium: 9 mg/dL (ref 8.9–10.3)
Chloride: 105 mmol/L (ref 98–111)
Creatinine, Ser: 1.06 mg/dL (ref 0.61–1.24)
GFR, Estimated: >60 mL/min (ref >60)
Glucose, Bld: 104 mg/dL — ABNORMAL HIGH (ref 70–99)
Potassium: 4.2 mmol/L (ref 3.5–5.1)
Sodium: 136 mmol/L (ref 135–145)
Total Bilirubin: 0.7 mg/dL (ref 0.0–1.2)
Total Protein: 7.1 g/dL (ref 6.5–8.1)

## 2023-06-20 LAB — CBG MONITORING, ED: Glucose-Capillary: 106 mg/dL — ABNORMAL HIGH (ref 70–99)

## 2023-06-20 LAB — MAGNESIUM: Magnesium: 1.8 mg/dL (ref 1.7–2.4)

## 2023-06-20 MED ORDER — FENTANYL CITRATE PF 50 MCG/ML IJ SOSY
50.0000 ug | PREFILLED_SYRINGE | INTRAMUSCULAR | Status: DC | PRN
  Administered 2023-06-20: 50 ug via INTRAVENOUS
  Filled 2023-06-20: qty 1

## 2023-06-20 MED ORDER — LEVETIRACETAM (KEPPRA) 500 MG/5 ML ADULT IV PUSH
4000.0000 mg | Freq: Once | INTRAVENOUS | Status: AC
  Administered 2023-06-20: 4000 mg via INTRAVENOUS
  Filled 2023-06-20: qty 40

## 2023-06-20 MED ORDER — IBUPROFEN 600 MG PO TABS
600.0000 mg | ORAL_TABLET | Freq: Four times a day (QID) | ORAL | 0 refills | Status: AC | PRN
Start: 2023-06-20 — End: ?

## 2023-06-20 MED ORDER — LEVETIRACETAM 500 MG PO TABS: 500.0000 mg | ORAL_TABLET | Freq: Two times a day (BID) | ORAL | 0 refills | Status: DC

## 2023-06-20 MED ORDER — ONDANSETRON HCL 4 MG/2ML IJ SOLN
4.0000 mg | Freq: Once | INTRAMUSCULAR | Status: AC
  Administered 2023-06-20: 4 mg via INTRAVENOUS
  Filled 2023-06-20: qty 2

## 2023-06-20 MED ORDER — IBUPROFEN 400 MG PO TABS
600.0000 mg | ORAL_TABLET | Freq: Once | ORAL | Status: AC
  Administered 2023-06-20: 600 mg via ORAL
  Filled 2023-06-20: qty 2

## 2023-06-20 NOTE — ED Triage Notes (Signed)
 Pt arrived [facility] from home. Pt was working outside with his father and had a seizure per father. Pt fell head first onto the ground and onto right shoulder. Pt has blood on his head, tongue has been bitten, right shoulder in a lot of pain , pt unable to move shoulder. [facility] stated pt has been post ictal with confusion and combativeness. 20 G Left AC. CBG 88

## 2023-06-20 NOTE — ED Provider Notes (Signed)
  EMERGENCY DEPARTMENT AT [facility] Provider Note   CSN: [id number] Arrival date & time: 06/20/23  1212     History {Add pertinent medical, surgical, social history, OB history to HPI:1} Chief Complaint  Patient presents with   Seizures    Jeffrey Compton is a 43 y.o. male.  HPI    43 year old male Home Medications Prior to Admission medications   Medication Sig Start Date End Date Taking? Authorizing Provider  levETIRAcetam (KEPPRA) 500 MG tablet Take 1 tablet (500 mg total) by mouth 2 (two) times daily. 06/20/23  Yes Deatra Face, MD      Allergies    Celexa [citalopram hydrobromide] and Keflex [cephalexin]    Review of Systems   Review of Systems  Physical Exam Updated Vital Signs BP 123/75   Pulse 92   Temp (!) 97.5 F (36.4 C) (Oral)   Resp 20   Ht 5\' 10"  (1.778 m)   Wt 99.8 kg   SpO2 91%   BMI 31.57 kg/m  Physical Exam  ED Results / Procedures / Treatments   Labs (all labs ordered are listed, but only abnormal results are displayed) Labs Reviewed  COMPREHENSIVE METABOLIC PANEL WITH GFR - Abnormal; Notable for the following components:      Result Value   CO2 21 (*)    Glucose, Bld 104 (*)    All other components within normal limits  CBG MONITORING, ED - Abnormal; Notable for the following components:   Glucose-Capillary 106 (*)    All other components within normal limits  CBC WITH DIFFERENTIAL/PLATELET  MAGNESIUM     EKG None  Radiology DG Shoulder Right Portable Result Date: 06/20/2023 CLINICAL DATA:  pain/fall, seizure EXAM: RIGHT SHOULDER - 1 VIEW COMPARISON:  December 12, 2022 FINDINGS: No acute fracture or dislocation. There is no evidence of arthropathy or other focal bone abnormality. Soft tissues are unremarkable. IMPRESSION: No acute fracture or dislocation. Electronically Signed   By: Rance Burrows M.D.   On: 06/20/2023 14:14   CT Head Wo Contrast Result Date: 06/20/2023 CLINICAL DATA:  New onset  seizure. EXAM: CT HEAD WITHOUT CONTRAST TECHNIQUE: Contiguous axial images were obtained from the base of the skull through the vertex without intravenous contrast. RADIATION DOSE REDUCTION: This exam was performed according to the departmental dose-optimization program which includes automated exposure control, adjustment of the mA and/or kV according to patient size and/or use of iterative reconstruction technique. COMPARISON:  12/12/2022 FINDINGS: Brain: There is no evidence for acute hemorrhage, hydrocephalus, mass lesion, or abnormal extra-axial fluid collection. No definite CT evidence for acute infarction. Vascular: No hyperdense vessel or unexpected calcification. Skull: No evidence for fracture. No worrisome lytic or sclerotic lesion. Sinuses/Orbits: The visualized paranasal sinuses and mastoid air cells are clear. Visualized portions of the globes and intraorbital fat are unremarkable. Other: None. IMPRESSION: No acute intracranial abnormality. Electronically Signed   By: Donnal Fusi M.D.   On: 06/20/2023 13:56    Procedures Procedures  {Document cardiac monitor, telemetry assessment procedure when appropriate:1}  Medications Ordered in ED Medications  fentaNYL  (SUBLIMAZE ) injection 50 mcg (50 mcg Intravenous Given 06/20/23 1258)  levETIRAcetam (KEPPRA) undiluted injection 4,000 mg (has no administration in time range)  ondansetron  (ZOFRAN ) injection 4 mg (4 mg Intravenous Given 06/20/23 1349)    ED Course/ Medical Decision Making/ A&P   {   Click here for ABCD2, HEART and other calculatorsREFRESH Note before signing :1}  Medical Decision Making Amount and/or Complexity of Data Reviewed Labs: ordered. Radiology: ordered.  Risk Prescription drug management.   ***  {Document critical care time when appropriate:1} {Document review of labs and clinical decision tools ie heart score, Chads2Vasc2 etc:1}  {Document your independent review of radiology  images, and any outside records:1} {Document your discussion with family members, caretakers, and with consultants:1} {Document social determinants of health affecting pt's care:1} {Document your decision making why or why not admission, treatments were needed:1} Final Clinical Impression(s) / ED Diagnoses Final diagnoses:  Seizure (HCC)    Rx / DC Orders ED Discharge Orders          Ordered    levETIRAcetam (KEPPRA) 500 MG tablet  2 times daily        06/20/23 1527    Ambulatory referral to Neurology       Comments: An appointment is requested in approximately: 2 weeks   06/20/23 1527

## 2023-06-20 NOTE — ED Notes (Signed)
 Patient now alert and oriented x 4. Neurologically intact.

## 2023-06-20 NOTE — Progress Notes (Signed)
 On-call neurology note  Received a call from Dr. Lewis Red at [facility], ER.  Patient presented there with seizure followed by postictal confusion.  Has had a previous 1 or 2 episodes which were presumed to be syncope and has seen a neurologist where a workup with EEG and MRI was recommended. This time around he had a seizure, with tongue bite and somewhat of a postictal phase. It is reasonable to start now on antiseizure medications. I would recommend loading with Keppra 4 g IV x 1 and starting 500 twice daily Keppra from tonight onwards. He should follow-up with Dr. Ty Gales in the next coming few weeks. [place] law permitting driving unless seizure-free for 6 months should be discussed in detail. Rest of the seizure precautions are below. Head CT unremarkable during this admission. MRI from January also unremarkable  -- Tona Francis, MD Neurologist [facility]   Approximately 8 minutes of time were spent with the provider for this case discussion

## 2023-06-20 NOTE — ED Notes (Signed)
 Seizure pads placed on each side of patient.

## 2023-06-20 NOTE — Discharge Instructions (Addendum)
 You were seen in the ER for seizure.  CAT scan of your brain, x-ray of your shoulder look fine.  Blood work is normal. Our neurologist recommends that we start you on Keppra at this time, which is a seizure medication.  Please call Dr. Ty Gales, for a follow-up in 2 weeks.  X-ray of your shoulder shows no evidence of fracture or dislocation.  Take ibuprofen for pain control.  If the symptoms continue despite ice, ibuprofen then call orthopedic surgeon at the number provided.  [place] law prevents people with seizures or fainting from driving or operating dangerous machinery until they are free of seizures or fainting for 6 months.

## 2023-06-20 NOTE — ED Notes (Signed)
 Pt is vomiting and requested meds. EDP gave verbal order.

## 2023-06-23 ENCOUNTER — Telehealth: Payer: Self-pay | Admitting: Neurology

## 2023-06-23 ENCOUNTER — Encounter: Payer: Self-pay | Admitting: Physician Assistant

## 2023-06-23 NOTE — Telephone Encounter (Signed)
 Called the patient yesterday and today and left messages. Wanting to get him scheduled for an appointment with us .

## 2023-06-24 DIAGNOSIS — M25511 Pain in right shoulder: Secondary | ICD-10-CM | POA: Diagnosis not present

## 2023-06-24 NOTE — Telephone Encounter (Signed)
 Spoke to mother. He is very ill, irritable, short-fused much more since the weekend since the seizure. He is in denial of everything. 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 Discussed that Keppra can cause irritability, we can certainly switch medication if necessary, but his irritability may also be due to another 6 months of no driving. His mother agrees this is the case and states he is not violent, just angry. We agreed to stay the course with Keppra 500mg  BID for now, and may switch to Oxcarbazepine 300mg  BID if anger issues worsen (weaning off Keppra to 1 tab at bedtime for 1 week, then stop). She also provides additional information that his biological mother and maternal grandfather had seizures.   He is on the cancellation list for f/u with me, mom knows to call for any changes

## 2023-06-24 NOTE — Telephone Encounter (Signed)
 Pts mother called and LM with AN. He is experiencing problems with his keppra medication. The pt is more agitated and angry. He is having seizures, he had 3,last one was Saturday. The symptoms are new to him. Will call at 8 to sch a follow up

## 2023-06-25 ENCOUNTER — Encounter: Payer: Self-pay | Admitting: Family Medicine

## 2023-07-06 ENCOUNTER — Telehealth (INDEPENDENT_AMBULATORY_CARE_PROVIDER_SITE_OTHER): Admitting: Neurology

## 2023-07-06 ENCOUNTER — Encounter: Payer: Self-pay | Admitting: Neurology

## 2023-07-06 VITALS — Ht 70.0 in | Wt 223.0 lb

## 2023-07-06 DIAGNOSIS — M25511 Pain in right shoulder: Secondary | ICD-10-CM | POA: Diagnosis not present

## 2023-07-06 DIAGNOSIS — R569 Unspecified convulsions: Secondary | ICD-10-CM

## 2023-07-06 MED ORDER — LEVETIRACETAM 500 MG PO TABS: 500.0000 mg | ORAL_TABLET | Freq: Two times a day (BID) | ORAL | 3 refills | Status: DC

## 2023-07-06 NOTE — Progress Notes (Signed)
 Virtual Visit via Video Note The purpose of this virtual visit is to provide medical care while limiting exposure to the novel coronavirus.    Consent was obtained for video visit:  Yes.   Answered questions that patient had about telehealth interaction:  Yes.   I discussed the limitations, risks, security and privacy concerns of performing an evaluation and management service by telemedicine. I also discussed with the patient that there may be a patient responsible charge related to this service. The patient expressed understanding and agreed to proceed.  Pt location: Home Physician Location: office Name of referring provider:  Deatra Face, MD I connected with Merlinda Starling at patients initiation/request on 07/06/2023 at  1:30 PM EDT by video enabled telemedicine application and verified that I am speaking with the correct person using two identifiers. Pt MRN:  [id number] Pt DOB:  Jan 17, 1981 Video Participants:  Merlinda Starling   History of Present Illness:  The patient had a virtual video visit on 07/06/2023. He was last seen in the neurology clinic 6 months ago after a syncopal episode in 11/2022. I personally reviewed MRI brain with and without contrast done 01/2023 which did not show any acute changes, hippocampi symmetric with no abnormal signal or enhancement seen. His 1-hour wale and sleep EEG in 01/2023 was normal. Since his last visit, he was in the ER on 06/20/23 for seizure. ER notes report that he alerted his father about weakness on the left side, unable to stand so his father helped him down. He then fell with some generalized shaking and unresponsiveness with tongue bite. Per notes, he was unresponsive for at least 20 minutes. He states today that it was his RIGHT side that felt numb and weak while they were standing in the yard. He denies feeling lightheaded. He has had right shoulder pain since the syncopal episode in 11/2022 but that day he could not lift his right arm  and leg was also weak. No clonic activity that he recalls prior to the weakness. He then woke up in the hospital with no further weakness but right shoulder was painful. He was brought to the ER where bloodwork was unremarkable, discharged home on Levetiracetam  500mg  BID. His mother contacted our office on 6/3 that he was very ill, irritable, short-fused. She reports there was another episode of loss of consciousness in March 2025 while mowing his neighbor's yard, he refused transport and did not report event to his physicians. His mother provided additional information that his biological mother and maternal grandfather had seizures.   He denies any mood changes or anger issues. He states he feels tired since starting the Levetiracetam . He denies any staring/unresponsive episodes, gaps in time, olfactory/gustatory hallucinations, rising epigastric sensation, deja vu, focal numbness/tingling/weakness (he still has sharp right shoulder pain), myoclonic jerks. No has had a few headaches, no dizziness.    History on Initial Assessment 01/19/2023: This is a pleasant 43 year old left-handed man with a history of diet-controlled hyperlipidemia, OCD, Asperger syndrome, presenting for evaluation of an episode of loss of consciousness that occurred on 12/12/22. ER notes reviewed, he reported feeling slightly lightheaded then waking up on the ground feeling slightly confused and lethargic. He felt fine going to work and ate breakfast but not lunch. He was brought to the ER after 1pm. He recalls putting wood in his trailer then felt lightheaded and sat down. He felt better so he got back up and recalls putting another piece of wood in, then waking up  to a coworker talking to him to get up. He could talk but felt kind of confused, he said Dorthy Gavia was the president. No tongue bite, incontinence, or focal weakness. He thinks he was hot and does not drink as much water in the winter. He recalls feeling sore and tired in  the ER, no headaches, dizziness. EMS reports BG 89, HR 129, BP 130/73. In the ER, EKG showed sinus tachycardia, bloodwork was normal. I personally reviewed head CT without contrast which did not show any acute changes. He reported 4-5 years ago, he had a witnessed convulsion-like activity at home but did not follow-up with Neurology. He does not remember this today and states the only other time he lost consciousness was at age 67 while out in the yard. There is an ER visit on 02/17/2018 for syncope. He had not eaten much then in the afternoon felt lightheaded and a stinging in his head, then woke up on the ground. A neighbor saw him on the ground and called EMS, glucose 56. He had dislocated the left shoulder.    He denies any staring/unresponsive episodes, gaps in time, olfactory/gustatory hallucinations, focal numbness/tingling/weakness, myoclonic jerks. He denies any further feeling of lightheadedness. He recalls having some chest pain in August, none recently. No palpitations. Since the incident, he has had headaches in the back of his head with pain in the nape on the left side the past 3 days. Tylenol  helps. No diplopia, dysarthria/dysphagia, back pain, bowel/bladder dysfunction. He has had some fatigue off and on for a few years, but since the incident in November, the fatigue has been constant. He gets 6-8 hours of sleep and may have been sleep deprived prior to the incident. He notes it has been a stressful year. He has been told he snores, he has occasional daytime drowsiness. His appetite has also changed. He was taking Cymbalta , which he had stopped prior to the incident. He takes Adderall twice a week. He denies any alcohol use. He lives alone. He works for the city International aid/development worker, driving and Industrial/product designer. Memory is good. He had a normal birth and early development.  There is no history of febrile convulsions, CNS infections such as meningitis/encephalitis, significant traumatic brain  injury, neurosurgical procedures, or family history of seizures.    Current Outpatient Medications on File Prior to Visit  Medication Sig Dispense Refill   ibuprofen  (ADVIL ) 600 MG tablet Take 1 tablet (600 mg total) by mouth every 6 (six) hours as needed. 30 tablet 0   levETIRAcetam  (KEPPRA ) 500 MG tablet Take 1 tablet (500 mg total) by mouth 2 (two) times daily. 60 tablet 0   No current facility-administered medications on file prior to visit.     Observations/Objective:   Vitals:   07/06/23 1323  Weight: 223 lb (101.2 kg)  Height: 5' 10 (1.778 m)   GEN:  The patient appears stated age and is in NAD. Neurological examination: Patient is awake, alert. No aphasia or dysarthria. Intact fluency and comprehension. Cranial nerves: Extraocular movements intact. No facial asymmetry. Motor: moves all extremities symmetrically, at least anti-gravity x 4.    Assessment and Plan:   This is a 43 yo LH man with a history of diet-controlled hyperlipidemia, OCD, Asperger syndrome, with recurrent episodes of loss of consciousness, most recently with generalized shaking and unresponsiveness preceded by right-sided weakness on 06/20/23. MRI brain and routine EEG normal. Etiology of seizures unclear, we discussed doing a 48-hour EEG for further characterization. We discussed rationale behind  starting seizure medication, with a recurrence risk of 73% after a second seizure. His mother expressed concern about mood changes since starting Levetiracetam , which he repeatedly denies today. I discussed with his mother that mood changes may also relate to frustration with driving restrictions. He states he has lost his job,we discussed the American Disabilities Act (ADA) and epilepsy in the workplace/request for accommodations. Follow-up in 3 months, call for any changes.    Follow Up Instructions:   -I discussed the assessment and treatment plan with the patient. The patient was provided an opportunity to ask  questions and all were answered. The patient agreed with the plan and demonstrated an understanding of the instructions.   The patient was advised to call back or seek an in-person evaluation if the symptoms worsen or if the condition fails to improve as anticipated.    Jhonny Moss, MD

## 2023-07-06 NOTE — Patient Instructions (Signed)
 Good to see you.  Schedule 2-day home EEG  2. Continue Levetiracetam  (Keppra ) 500mg  twice a day. Let me know if side effects are intolerable  3. Follow-up in 3 months, call for any changes   Seizure Precautions: 1. If medication has been prescribed for you to prevent seizures, take it exactly as directed.  Do not stop taking the medicine without talking to your doctor first, even if you have not had a seizure in a long time.   2. Avoid activities in which a seizure would cause danger to yourself or to others.  Don't operate dangerous machinery, swim alone, or climb in high or dangerous places, such as on ladders, roofs, or girders.  Do not drive unless your doctor says you may.  3. If you have any warning that you may have a seizure, lay down in a safe place where you can't hurt yourself.    4.  No driving for 6 months from last seizure, as per [place]  state law.   Please refer to the following link on the Epilepsy Foundation of America's website for more information: http://www.epilepsyfoundation.org/answerplace/Social/driving/drivingu.cfm   5.  Maintain good sleep hygiene. Avoid alcohol.  6.  Contact your doctor if you have any problems that may be related to the medicine you are taking.  7.  Call 911 and bring the patient back to the ED if:        A.  The seizure lasts longer than 5 minutes.       B.  The patient doesn't awaken shortly after the seizure  C.  The patient has new problems such as difficulty seeing, speaking or moving  D.  The patient was injured during the seizure  E.  The patient has a temperature over 102 F (39C)  F.  The patient vomited and now is having trouble breathing

## 2023-07-07 ENCOUNTER — Telehealth: Payer: Self-pay | Admitting: Neurology

## 2023-07-07 NOTE — Telephone Encounter (Signed)
 Called and LM to get him a f/u in 3 months with aquino- work in

## 2023-07-09 ENCOUNTER — Telehealth: Payer: Self-pay | Admitting: *Deleted

## 2023-07-14 DIAGNOSIS — H61301 Acquired stenosis of right external ear canal, unspecified: Secondary | ICD-10-CM | POA: Diagnosis not present

## 2023-07-14 DIAGNOSIS — H6123 Impacted cerumen, bilateral: Secondary | ICD-10-CM | POA: Diagnosis not present

## 2023-07-14 DIAGNOSIS — H60331 Swimmer's ear, right ear: Secondary | ICD-10-CM | POA: Diagnosis not present

## 2023-07-15 ENCOUNTER — Telehealth: Payer: Self-pay | Admitting: Family Medicine

## 2023-07-15 NOTE — Telephone Encounter (Signed)
 the patient's parents at a visit brought up the fact that he did not want to follow through with the neurologist he has and he is looking for a different neurologist basically I told the parents that they would need to have an Venetia call our office and we will assist him

## 2023-07-22 ENCOUNTER — Telehealth: Payer: Self-pay | Admitting: *Deleted

## 2023-07-29 DIAGNOSIS — M25511 Pain in right shoulder: Secondary | ICD-10-CM | POA: Diagnosis not present

## 2023-08-05 ENCOUNTER — Encounter: Payer: Self-pay | Admitting: *Deleted

## 2023-08-05 DIAGNOSIS — M25511 Pain in right shoulder: Secondary | ICD-10-CM | POA: Diagnosis not present

## 2023-08-27 ENCOUNTER — Ambulatory Visit: Admitting: Family Medicine

## 2023-08-27 ENCOUNTER — Encounter: Payer: Self-pay | Admitting: Family Medicine

## 2023-08-27 VITALS — BP 132/78 | HR 73 | Temp 97.5°F | Ht 70.0 in | Wt 232.0 lb

## 2023-08-27 DIAGNOSIS — F988 Other specified behavioral and emotional disorders with onset usually occurring in childhood and adolescence: Secondary | ICD-10-CM | POA: Diagnosis not present

## 2023-08-27 NOTE — Progress Notes (Signed)
   Subjective:    Patient ID: Jeffrey Compton, male    DOB: [birth year], 43 y.o.   MRN: [id number]  HPI Pt request a refill for his adderall , normally takes as needed Last taken a couple of months ago, states it helps   Patient states he has symptoms of ADHD difficult time focusing and staying on track he has been treated before with Adderall seem to help him but he would use it occasionally when he is working He is not currently working and also he has a seizure condition for which he is on medication  He relates he has a hard time staying focused.  He states when he is doing complex tasks he often gets sidetracked.  He will use Adderall in the past once or twice a week when he is doing complex tasks.  Patient is currently not working but is at home helping his parents.  He is not driving and will not be able to drive until he is 6 months out from his seizure as long as he is seizure-free-he understands this Review of Systems     Objective:   Physical Exam  General-in no acute distress Eyes-no discharge Lungs-respiratory rate normal, CTA CV-no murmurs,RRR Extremities skin warm dry no edema Neuro grossly normal Behavior normal, alert       Assessment & Plan:  Possible ADD We would have to touch base with his neurologist to see if ADD medicine can actually worsen the his potential for seizures For now we will hold off on medication We have reached out to his neurologist  Patient states not being able to drive is been difficult for him he is not suicidal but sometimes he does state he gets down he just tries to work through this on his own

## 2023-09-07 ENCOUNTER — Other Ambulatory Visit: Payer: Self-pay | Admitting: Family Medicine

## 2023-09-08 DIAGNOSIS — H60331 Swimmer's ear, right ear: Secondary | ICD-10-CM | POA: Diagnosis not present

## 2023-09-11 ENCOUNTER — Ambulatory Visit (INDEPENDENT_AMBULATORY_CARE_PROVIDER_SITE_OTHER): Admitting: Family Medicine

## 2023-09-11 VITALS — BP 121/78 | HR 78 | Temp 98.2°F | Ht 70.0 in | Wt 231.0 lb

## 2023-09-11 DIAGNOSIS — L719 Rosacea, unspecified: Secondary | ICD-10-CM

## 2023-09-11 MED ORDER — METRONIDAZOLE 0.75 % EX CREA: TOPICAL_CREAM | Freq: Two times a day (BID) | CUTANEOUS | 0 refills | Status: AC

## 2023-09-11 MED ORDER — DOXYCYCLINE MONOHYDRATE 100 MG PO CAPS: 100.0000 mg | ORAL_CAPSULE | Freq: Two times a day (BID) | ORAL | 2 refills | Status: AC

## 2023-09-11 NOTE — Progress Notes (Signed)
 follow-up  Subjective:    Jeffrey Compton: Okay so what is ent ID: Bite, male    DOB: 24-May-1980, 43 y.o.   MRN: [id number]  HPI Patient with bumps on his nose been present over the past few months sometimes he gets a sore that will not heal and there is one currently he denies high fever chills sweats He denies sinus pain no high fever chills or sweats no other skin issues   Review of Systems     Objective:   Physical Exam  Lungs clear heart regular neck no masses the nasal area has enlarged rough areas on the skin consistent with possibility of acne rosacea but there is one spot that is a scab that he says will not heal      Assessment & Plan:  Referral to dermatology to make sure he does not have developing skin cancer Also recommend doxycycline  twice a day for the next month to see if it helps if it is acne rosacea Also recommend MetroCream  twice daily If he does not tolerate doxycycline  to let us  know

## 2023-10-22 ENCOUNTER — Ambulatory Visit (INDEPENDENT_AMBULATORY_CARE_PROVIDER_SITE_OTHER): Admitting: Neurology

## 2023-10-22 ENCOUNTER — Encounter: Payer: Self-pay | Admitting: Neurology

## 2023-10-22 VITALS — BP 127/82 | HR 69 | Ht 70.0 in | Wt 230.0 lb

## 2023-10-22 DIAGNOSIS — R569 Unspecified convulsions: Secondary | ICD-10-CM | POA: Diagnosis not present

## 2023-10-22 MED ORDER — LEVETIRACETAM 500 MG PO TABS: 500.0000 mg | ORAL_TABLET | Freq: Two times a day (BID) | ORAL | 3 refills | Status: AC

## 2023-10-22 NOTE — Patient Instructions (Signed)
 Good to see you doing well. Continue Keppra  500mg  twice a day. Please contact our office on December 1 to update about seizure-free status and we can write return to work letter. Follow-up in 6 months, call for any changes   Seizure Precautions: 1. If medication has been prescribed for you to prevent seizures, take it exactly as directed.  Do not stop taking the medicine without talking to your doctor first, even if you have not had a seizure in a long time.   2. Avoid activities in which a seizure would cause danger to yourself or to others.  Don't operate dangerous machinery, swim alone, or climb in high or dangerous places, such as on ladders, roofs, or girders.  Do not drive unless your doctor says you may.  3. If you have any warning that you may have a seizure, lay down in a safe place where you can't hurt yourself.    4.  No driving for 6 months from last seizure, as per [place]  state law.   Please refer to the following link on the Epilepsy Foundation of America's website for more information: http://www.epilepsyfoundation.org/answerplace/Social/driving/drivingu.cfm   5.  Maintain good sleep hygiene. Avoid alcohol  6.  Contact your doctor if you have any problems that may be related to the medicine you are taking.  7.  Call 911 and bring the patient back to the ED if:        A.  The seizure lasts longer than 5 minutes.       B.  The patient doesn't awaken shortly after the seizure  C.  The patient has new problems such as difficulty seeing, speaking or moving  D.  The patient was injured during the seizure  E.  The patient has a temperature over 102 F (39C)  F.  The patient vomited and now is having trouble breathing

## 2023-10-22 NOTE — Progress Notes (Signed)
 NEUROLOGY FOLLOW UP OFFICE NOTE  Jeffrey Compton [id number] Oct 16, 1980  HISTORY OF PRESENT ILLNESS: I had the pleasure of seeing Jeffrey Compton in follow-up in the neurology clinic on 10/22/2023. He is accompanied by his mother who helps supplement the history today.  The patient was last seen 3 months ago for seizures. Records and images were personally reviewed where available. He had a syncopal episode in 11/2022. MRI brain and 1-hour EEG normal. His mother reported another episode of loss of consciousness in 03/2023, then he had a witnessed convulsion on 06/20/23. He was started on Levetiracetam  500mg  BID which initially caused irritability, however this has leveled off. He states mood is fine and his mother agrees. He denies any staring/unresponsive episodes, gaps in time, olfactory/gustatory hallucinations, focal numbness/tingling/weakness, myoclonic jerks. No headaches, dizziness, vision changes, no falls. Sleep is not too good, he get 8-9 hours and denies daytime drowsiness.    History on Initial Assessment 01/19/2023: This is a pleasant 43 year old left-handed man with a history of diet-controlled hyperlipidemia, OCD, Asperger syndrome, presenting for evaluation of an episode of loss of consciousness that occurred on 12/12/22. ER notes reviewed, he reported feeling slightly lightheaded then waking up on the ground feeling slightly confused and lethargic. He felt fine going to work and ate breakfast but not lunch. He was brought to the ER after 1pm. He recalls putting wood in his trailer then felt lightheaded and sat down. He felt better so he got back up and recalls putting another piece of wood in, then waking up to a coworker talking to him to get up. He could talk but felt kind of confused, he said Zachary Neighbors was the president. No tongue bite, incontinence, or focal weakness. He thinks he was hot and does not drink as much water in the winter. He recalls feeling sore and tired in the ER,  no headaches, dizziness. EMS reports BG 89, HR 129, BP 130/73. In the ER, EKG showed sinus tachycardia, bloodwork was normal. I personally reviewed head CT without contrast which did not show any acute changes. He reported 4-5 years ago, he had a witnessed convulsion-like activity at home but did not follow-up with Neurology. He does not remember this today and states the only other time he lost consciousness was at age 43 while out in the yard. There is an ER visit on 02/17/2018 for syncope. He had not eaten much then in the afternoon felt lightheaded and a stinging in his head, then woke up on the ground. A neighbor saw him on the ground and called EMS, glucose 56. He had dislocated the left shoulder.    He denies any staring/unresponsive episodes, gaps in time, olfactory/gustatory hallucinations, focal numbness/tingling/weakness, myoclonic jerks. He denies any further feeling of lightheadedness. He recalls having some chest pain in August, none recently. No palpitations. Since the incident, he has had headaches in the back of his head with pain in the nape on the left side the past 3 days. Tylenol  helps. No diplopia, dysarthria/dysphagia, back pain, bowel/bladder dysfunction. He has had some fatigue off and on for a few years, but since the incident in November, the fatigue has been constant. He gets 6-8 hours of sleep and may have been sleep deprived prior to the incident. He notes it has been a stressful year. He has been told he snores, he has occasional daytime drowsiness. His appetite has also changed. He was taking Cymbalta , which he had stopped prior to the incident. He takes Adderall twice  a week. He denies any alcohol use. He lives alone. He works for the city International aid/development worker, driving and Industrial/product designer. Memory is good. He had a normal birth and early development.  There is no history of febrile convulsions, CNS infections such as meningitis/encephalitis, significant traumatic brain injury,  neurosurgical procedures, or family history of seizures.   PAST MEDICAL HISTORY: Past Medical History:  Diagnosis Date   Asperger syndrome    Hyperlipidemia    OCD (obsessive compulsive disorder)     MEDICATIONS: Current Outpatient Medications on File Prior to Visit  Medication Sig Dispense Refill   doxycycline  (MONDOXYNE NL) 100 MG capsule Take 1 capsule (100 mg total) by mouth 2 (two) times daily. 60 capsule 2   ibuprofen  (ADVIL ) 600 MG tablet Take 1 tablet (600 mg total) by mouth every 6 (six) hours as needed. 30 tablet 0   levETIRAcetam  (KEPPRA ) 500 MG tablet Take 1 tablet (500 mg total) by mouth 2 (two) times daily. 180 tablet 3   metroNIDAZOLE  (METROCREAM ) 0.75 % cream Apply topically 2 (two) times daily. 45 g 0   No current facility-administered medications on file prior to visit.    ALLERGIES: Allergies  Allergen Reactions   Celexa [Citalopram Hydrobromide] Other (See Comments)    Night sweats   Keflex [Cephalexin] Rash    FAMILY HISTORY: Family History  Problem Relation Age of Onset   Heart attack Maternal Grandfather    Heart attack Paternal Grandfather    Heart disease Other     SOCIAL HISTORY: Social History   Socioeconomic History   Marital status: Single    Spouse name: Not on file   Number of children: Not on file   Years of education: Not on file   Highest education level: Not on file  Occupational History   Not on file  Tobacco Use   Smoking status: Never   Smokeless tobacco: Never  Vaping Use   Vaping status: Never Used  Substance and Sexual Activity   Alcohol use: Not Currently    Comment: occasional   Drug use: No   Sexual activity: Not on file  Other Topics Concern   Not on file  Social History Narrative   Are you right handed or left handed? Left    Are you currently employed ?    What is your current occupation? Worked for the [employer] you live at home alone? no   Who lives with you? Lives with family    What type of home do  you live in: 1 story or 2 story?  1 story       Social Drivers of Corporate investment banker Strain: Low Risk  (05/09/2022)   Overall Financial Resource Strain (CARDIA)    Difficulty of Paying Living Expenses: Not hard at all  Food Insecurity: Low Risk  (05/13/2023)   Received from [facility]   Hunger Vital Sign    Within the past 12 months, you worried that your food would run out before you got money to buy more: Never true    Within the past 12 months, the food you bought just didn't last and you didn't have money to get more. : Never true  Transportation Needs: No Transportation Needs (05/13/2023)   Received from [facility]    In the past 12 months, has lack of reliable transportation kept you from medical appointments, meetings, work or from getting things needed for daily living? : No  Physical Activity:  Sufficiently Active (05/09/2022)   Exercise Vital Sign    Days of Exercise per Week: 5 days    Minutes of Exercise per Session: 60 min  Stress: No Stress Concern Present (05/09/2022)   [employer] of Occupational Health - Occupational Stress Questionnaire    Feeling of Stress : Only a little  Social Connections: Socially Isolated (05/09/2022)   Social Connection and Isolation Panel    Frequency of Communication with Friends and Family: More than three times a week    Frequency of Social Gatherings with Friends and Family: More than three times a week    Attends Religious Services: Never    Database administrator or Organizations: No    Attends Banker Meetings: Never    Marital Status: Never married  Intimate Partner Violence: Not At Risk (05/09/2022)   Humiliation, Afraid, Rape, and Kick questionnaire    Fear of Current or Ex-Partner: No    Emotionally Abused: No    Physically Abused: No    Sexually Abused: No     PHYSICAL EXAM: Vitals:   10/22/23 1135  BP: 127/82  Pulse: 69  SpO2: 99%   General: No acute distress Head:   Normocephalic/atraumatic Skin/Extremities: No rash, no edema Neurological Exam: alert and awake. No aphasia or dysarthria. Fund of knowledge is appropriate.  Attention and concentration are normal.   Cranial nerves: Pupils equal, round. Extraocular movements intact with no nystagmus. Visual fields full.  No facial asymmetry.  Motor: Bulk and tone normal, muscle strength 5/5 throughout with no pronator drift.   Finger to nose testing intact.  Gait narrow-based and steady, no ataxia.  Romberg negative.   IMPRESSION: This is a 43 yo LH man with a history of diet-controlled hyperlipidemia, OCD, Asperger syndrome, with recurrent episodes of loss of consciousness, most recently with generalized shaking and unresponsiveness preceded by right-sided weakness on 06/20/23. MRI brain and routine EEG normal. Etiology of seizures unclear. He has not done the 48-hour EEG and has been seizure-free since 06/20/2023. Continue Levetiracetam  500mg  BID, refills sent. He is aware of [place] driving laws to stop driving after a seizure until 6 months seizure-free. He will contact our office once 6 months seizure-free at which point we can write a return to work letter for him. Follow-up in 6 months, call for any changes.    Thank you for allowing me to participate in his care.  Please do not hesitate to call for any questions or concerns.   Darice Shivers, M.D.   CC: Dr. Alphonsa

## 2023-11-03 DIAGNOSIS — H60331 Swimmer's ear, right ear: Secondary | ICD-10-CM | POA: Diagnosis not present

## 2023-11-03 DIAGNOSIS — H6061 Unspecified chronic otitis externa, right ear: Secondary | ICD-10-CM | POA: Diagnosis not present

## 2023-11-03 DIAGNOSIS — H6122 Impacted cerumen, left ear: Secondary | ICD-10-CM | POA: Diagnosis not present

## 2023-12-08 ENCOUNTER — Encounter: Payer: Self-pay | Admitting: Neurology

## 2024-02-01 ENCOUNTER — Telehealth: Payer: Self-pay | Admitting: Neurology

## 2024-02-01 NOTE — Telephone Encounter (Signed)
 Pt called back to see what was needed for the release no answer no voice mail set up

## 2024-02-01 NOTE — Telephone Encounter (Signed)
 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

## 2024-02-02 ENCOUNTER — Telehealth: Payer: Self-pay | Admitting: Neurology

## 2024-02-02 NOTE — Telephone Encounter (Signed)
 Pt mother called asking for a letter stated that he is 6 months seizure free and clear to go back to work he is trying to get back on with the state to work.,

## 2024-02-02 NOTE — Telephone Encounter (Signed)
 See other phone note.

## 2024-02-02 NOTE — Telephone Encounter (Signed)
 Pt mom rtrnd [place] call, pls cl bk

## 2024-02-02 NOTE — Telephone Encounter (Signed)
 Pt mother call left a number to call the office back

## 2024-02-04 ENCOUNTER — Encounter: Payer: Self-pay | Admitting: Neurology

## 2024-02-04 NOTE — Telephone Encounter (Signed)
 Done, thanks

## 2024-02-04 NOTE — Telephone Encounter (Signed)
 Spoke with pt mother informed her that the letter is ready

## 2024-02-08 ENCOUNTER — Encounter (HOSPITAL_COMMUNITY): Payer: Self-pay

## 2024-02-08 ENCOUNTER — Emergency Department (HOSPITAL_COMMUNITY)
Admission: EM | Admit: 2024-02-08 | Discharge: 2024-02-08 | Disposition: A | Discharge status: Home / Self Care | Attending: Emergency Medicine | Admitting: Emergency Medicine

## 2024-02-08 ENCOUNTER — Emergency Department (HOSPITAL_COMMUNITY)

## 2024-02-08 ENCOUNTER — Other Ambulatory Visit: Payer: Self-pay

## 2024-02-08 DIAGNOSIS — R410 Disorientation, unspecified: Secondary | ICD-10-CM | POA: Insufficient documentation

## 2024-02-08 DIAGNOSIS — R000 Tachycardia, unspecified: Secondary | ICD-10-CM | POA: Diagnosis not present

## 2024-02-08 DIAGNOSIS — X58XXXA Exposure to other specified factors, initial encounter: Secondary | ICD-10-CM | POA: Insufficient documentation

## 2024-02-08 DIAGNOSIS — S01552A Open bite of oral cavity, initial encounter: Secondary | ICD-10-CM | POA: Insufficient documentation

## 2024-02-08 DIAGNOSIS — R569 Unspecified convulsions: Secondary | ICD-10-CM | POA: Diagnosis not present

## 2024-02-08 HISTORY — DX: Unspecified convulsions: R56.9

## 2024-02-08 LAB — COMPREHENSIVE METABOLIC PANEL WITH GFR
ALT: 39 U/L (ref 0–44)
AST: 32 U/L (ref 15–41)
Albumin: 4.6 g/dL (ref 3.5–5.0)
Alkaline Phosphatase: 64 U/L (ref 38–126)
Anion gap: 15 (ref 5–15)
BUN: 17 mg/dL (ref 6–20)
CO2: 21 mmol/L — ABNORMAL LOW (ref 22–32)
Calcium: 8.7 mg/dL — ABNORMAL LOW (ref 8.9–10.3)
Chloride: 102 mmol/L (ref 98–111)
Creatinine, Ser: 1.03 mg/dL (ref 0.61–1.24)
GFR, Estimated: >60 mL/min
Glucose, Bld: 107 mg/dL — ABNORMAL HIGH (ref 70–99)
Potassium: 4.5 mmol/L (ref 3.5–5.1)
Sodium: 138 mmol/L (ref 135–145)
Total Bilirubin: 0.5 mg/dL (ref 0.0–1.2)
Total Protein: 7.6 g/dL (ref 6.5–8.1)

## 2024-02-08 LAB — CBC WITH DIFFERENTIAL/PLATELET
Abs Immature Granulocytes: 0.02 K/uL (ref 0.00–0.07)
Basophils Absolute: 0 K/uL (ref 0.0–0.1)
Basophils Relative: 1 %
Eosinophils Absolute: 0.1 K/uL (ref 0.0–0.5)
Eosinophils Relative: 2 %
HCT: 44.3 % (ref 39.0–52.0)
Hemoglobin: 15.1 g/dL (ref 13.0–17.0)
Immature Granulocytes: 0 %
Lymphocytes Relative: 26 %
Lymphs Abs: 1.3 K/uL (ref 0.7–4.0)
MCH: 29.8 pg (ref 26.0–34.0)
MCHC: 34.1 g/dL (ref 30.0–36.0)
MCV: 87.4 fL (ref 80.0–100.0)
Monocytes Absolute: 0.4 K/uL (ref 0.1–1.0)
Monocytes Relative: 8 %
Neutro Abs: 3 K/uL (ref 1.7–7.7)
Neutrophils Relative %: 63 %
Platelets: 263 K/uL (ref 150–400)
RBC: 5.07 MIL/uL (ref 4.22–5.81)
RDW: 12.4 % (ref 11.5–15.5)
WBC: 4.8 K/uL (ref 4.0–10.5)
nRBC: 0 % (ref 0.0–0.2)

## 2024-02-08 LAB — VALPROIC ACID LEVEL: Valproic Acid Lvl: <10 ug/mL — ABNORMAL LOW (ref 50–100)

## 2024-02-08 MED ORDER — SODIUM CHLORIDE 0.9 % IV BOLUS
1000.0000 mL | Freq: Once | INTRAVENOUS | Status: AC
  Administered 2024-02-08: 1000 mL via INTRAVENOUS

## 2024-02-08 MED ORDER — TETRACAINE HCL 0.5 % OP SOLN
2.0000 [drp] | Freq: Once | OPHTHALMIC | Status: AC
  Administered 2024-02-08: 2 [drp] via OPHTHALMIC
  Filled 2024-02-08: qty 4

## 2024-02-08 MED ORDER — PROCHLORPERAZINE EDISYLATE 10 MG/2ML IJ SOLN
5.0000 mg | Freq: Once | INTRAMUSCULAR | Status: AC
  Administered 2024-02-08: 5 mg via INTRAVENOUS
  Filled 2024-02-08: qty 2

## 2024-02-08 MED ORDER — KETOROLAC TROMETHAMINE 15 MG/ML IJ SOLN
15.0000 mg | Freq: Once | INTRAMUSCULAR | Status: AC
  Administered 2024-02-08: 15 mg via INTRAVENOUS
  Filled 2024-02-08: qty 1

## 2024-02-08 MED ORDER — ONDANSETRON HCL 4 MG/2ML IJ SOLN
4.0000 mg | Freq: Once | INTRAMUSCULAR | Status: AC
  Administered 2024-02-08: 4 mg via INTRAVENOUS
  Filled 2024-02-08: qty 2

## 2024-02-08 MED ORDER — FLUORESCEIN SODIUM 1 MG OP STRP
1.0000 | ORAL_STRIP | Freq: Once | OPHTHALMIC | Status: AC
  Administered 2024-02-08: 1 via OPHTHALMIC
  Filled 2024-02-08: qty 1

## 2024-02-08 NOTE — ED Triage Notes (Signed)
 Pt arrived via POV from home following an unwitnessed seizure/syncopal episode. Pt presents with confusion and is unsure if it was in fact a seizure or not, and reports recently having a 47-month seizure free period of time with hopes of returning to work.

## 2024-02-08 NOTE — Discharge Instructions (Addendum)
 It is important that you take your seizure medications as prescribed. Take a dose tonight when you get home. Follow up with your neurologist.

## 2024-02-08 NOTE — ED Notes (Signed)
 Patient placed on cardiac monitoring and seizure pads applied to the side of the bed.

## 2024-02-08 NOTE — ED Notes (Signed)
 Pt gone to CT

## 2024-02-08 NOTE — ED Notes (Signed)
 Pt pain has not improved, left eye is starting to hurt and problems opening it. Dr. Jerre aware.

## 2024-02-08 NOTE — ED Provider Notes (Addendum)
 " [place] EMERGENCY DEPARTMENT AT [facility] Provider Note   CSN: [id number] Arrival date & time: 02/08/24  1205     Patient presents with: Seizures   Jeffrey Compton is a 44 y.o. male.    Seizures Patient presents with syncopal episode.  Does have a history of seizures.  Unsure what happened.  Patient does not remember event.  Reportedly crawled up to mother's house.  History of seizures and has been seizure-free for months now.  No loss of bladder control.  Denies biting his tongue but does appear to have some wound on the tongue.  Is on Keppra .  Mildly confused now but states he is taking the pill once a day although disposed we dosed twice a day.     Prior to Admission medications  Medication Sig Start Date End Date Taking? Authorizing Provider  doxycycline  (MONDOXYNE NL) 100 MG capsule Take 1 capsule (100 mg total) by mouth 2 (two) times daily. 09/11/23   Alphonsa Glendia LABOR, MD  ibuprofen  (ADVIL ) 600 MG tablet Take 1 tablet (600 mg total) by mouth every 6 (six) hours as needed. 06/20/23   Charlyn Sora, MD  levETIRAcetam  (KEPPRA ) 500 MG tablet Take 1 tablet (500 mg total) by mouth 2 (two) times daily. 10/22/23   Georjean Darice HERO, MD  metroNIDAZOLE  (METROCREAM ) 0.75 % cream Apply topically 2 (two) times daily. 09/11/23   Alphonsa Glendia LABOR, MD    Allergies: Celexa [citalopram hydrobromide] and Keflex [cephalexin]    Review of Systems  Neurological:  Positive for seizures.    Updated Vital Signs BP 119/65   Pulse (!) 121   Temp 97.9 F (36.6 C)   Resp 18   Ht 5' 10 (1.778 m)   Wt 99.8 kg   SpO2 92%   BMI 31.57 kg/m   Physical Exam Vitals and nursing note reviewed.  HENT:     Head: Atraumatic.     Mouth/Throat:     Comments: Potential bite mark to right side of tongue. Cardiovascular:     Rate and Rhythm: Tachycardia present.  Pulmonary:     Breath sounds: No wheezing.  Abdominal:     Tenderness: There is no abdominal tenderness.  Skin:    General:  Skin is warm.  Neurological:     Mental Status: He is alert.     Comments: Awake and pleasant, but confusion to events.     (all labs ordered are listed, but only abnormal results are displayed) Labs Reviewed  COMPREHENSIVE METABOLIC PANEL WITH GFR - Abnormal; Notable for the following components:      Result Value   CO2 21 (*)    Glucose, Bld 107 (*)    Calcium 8.7 (*)    All other components within normal limits  VALPROIC ACID  LEVEL - Abnormal; Notable for the following components:   Valproic Acid  Lvl <10 (*)    All other components within normal limits  CBC WITH DIFFERENTIAL/PLATELET  LEVETIRACETAM  LEVEL    EKG: EKG Interpretation Date/Time:  Monday February 08 2024 12:17:59 EST Ventricular Rate:  124 PR Interval:  160 QRS Duration:  94 QT Interval:  310 QTC Calculation: 445 R Axis:   60  Text Interpretation: Sinus tachycardia Possible Lateral infarct , age undetermined Abnormal ECG When compared with ECG of 12-Dec-2022 13:51, ST no longer elevated in Inferior leads ST no longer elevated in Anterior leads Confirmed by Patsey Lot [phone]) on 02/08/2024 1:28:51 PM  Radiology: CT Head Wo Contrast Result Date: 02/08/2024 EXAM:  CT HEAD WITHOUT CONTRAST 02/08/2024 01:41:45 PM TECHNIQUE: CT of the head was performed without the administration of intravenous contrast. Automated exposure control, iterative reconstruction, and/or weight based adjustment of the mA/kV was utilized to reduce the radiation dose to as low as reasonably achievable. COMPARISON: 06/20/2023 CLINICAL HISTORY: Seizure disorder, clinical change. FINDINGS: BRAIN AND VENTRICLES: No acute hemorrhage. No evidence of acute infarct. No hydrocephalus. No extra-axial collection. No mass effect or midline shift. ORBITS: No acute abnormality. SINUSES: No acute abnormality. SOFT TISSUES AND SKULL: No acute soft tissue abnormality. No skull fracture. MASTOIDS: Trace right mastoid effusion. IMPRESSION: 1. No acute  intracranial abnormality. Electronically signed by: Donnice Mania MD 02/08/2024 01:50 PM EST RP Workstation: [id number]     Procedures   Medications Ordered in the ED  fluorescein  ophthalmic strip 1 strip (has no administration in time range)  tetracaine  (PONTOCAINE) 0.5 % ophthalmic solution 2 drop (has no administration in time range)  prochlorperazine  (COMPAZINE ) injection 5 mg (has no administration in time range)  ondansetron  (ZOFRAN ) injection 4 mg (4 mg Intravenous Given 02/08/24 1319)  ketorolac  (TORADOL ) 15 MG/ML injection 15 mg (15 mg Intravenous Given 02/08/24 1345)                                    Medical Decision Making Amount and/or Complexity of Data Reviewed Labs: ordered. Radiology: ordered.  Risk Prescription drug management.   Patient with syncopal episode.  Likely seizure versus syncope.  History of seizures.  On Keppra  but questionable compliance.  Will get basic blood work.  Will get Keppra  level although will not return while patient is here.  Patient developed more nausea and then vomiting.  Will get head CT to further evaluate.  Head CT reassuring.  Now complaining of left eye pain.  Mild photophobia.  Will stain eye.  Does get headaches sometimes after seizures.  Will also treat with Compazine  now.  Care turned over to oncoming provider.  No corneal uptake on fluorescein  exam.  Will give fluid bolus however with mild persistent tachycardia.    Final diagnoses:  Seizure [facility])    ED Discharge Orders          Ordered    Ambulatory referral to Neurology       Comments: An appointment is requested in approximately: 2 weeks   02/08/24 1503               Patsey Lot, MD 02/08/24 1503    Patsey Lot, MD 02/08/24 1529  "

## 2024-02-08 NOTE — ED Provider Notes (Signed)
 I took over care of patient at 3:30 PM. He is pending fluid bolus. Feeling well and back to baseline at this time. Was here for seizure with possible medication noncompliance.  Physical Exam  BP 119/65   Pulse 96   Temp 97.9 F (36.6 C) (Oral)   Resp 18   Ht 5' 10 (1.778 m)   Wt 99.8 kg   SpO2 98%   BMI 31.57 kg/m   Physical Exam Vitals and nursing note reviewed.  Constitutional:      General: He is not in acute distress.    Appearance: He is well-developed.  HENT:     Head: Normocephalic and atraumatic.  Eyes:     Conjunctiva/sclera: Conjunctivae normal.  Cardiovascular:     Rate and Rhythm: Normal rate and regular rhythm.     Heart sounds: No murmur heard. Pulmonary:     Effort: Pulmonary effort is normal. No respiratory distress.     Breath sounds: Normal breath sounds.  Abdominal:     Palpations: Abdomen is soft.     Tenderness: There is no abdominal tenderness.  Musculoskeletal:        General: No swelling.     Cervical back: Neck supple.  Skin:    General: Skin is warm and dry.     Capillary Refill: Capillary refill takes less than 2 seconds.  Neurological:     Mental Status: He is alert.  Psychiatric:        Mood and Affect: Mood normal.     Procedures  Procedures  ED Course / MDM    Medical Decision Making Patient given IV fluids.  He is at his baseline with stable vitals.  Advised to take Keppra  when he gets home and to maintain compliance with this and follow-up with neurology.  All results plan assessed with patient and family at bedside they feel comfortable with plan to be discharged home.  Amount and/or Complexity of Data Reviewed Labs: ordered. Radiology: ordered.  Risk OTC drugs. Prescription drug management. Diagnosis or treatment significantly limited by social determinants of health.          Gennaro Duwaine CROME, DO 02/08/24 1807

## 2024-02-10 LAB — LEVETIRACETAM LEVEL: Levetiracetam Lvl: <2 ug/mL — ABNORMAL LOW (ref 10.0–40.0)

## 2024-03-21 ENCOUNTER — Ambulatory Visit: Admitting: Family Medicine

## 2024-04-18 ENCOUNTER — Ambulatory Visit: Admitting: Physician Assistant

## 2024-05-13 ENCOUNTER — Ambulatory Visit: Admitting: Neurology
